# Patient Record
Sex: Male | Born: 1955 | Hispanic: Yes | Marital: Married | Smoking: Former smoker
Health system: Southern US, Community
[De-identification: ages and names within clinical notes are randomized; demographics above are authoritative.]

## PROBLEM LIST (undated history)

## (undated) DIAGNOSIS — M1990 Unspecified osteoarthritis, unspecified site: Secondary | ICD-10-CM

## (undated) DIAGNOSIS — R7303 Prediabetes: Secondary | ICD-10-CM

## (undated) DIAGNOSIS — R011 Cardiac murmur, unspecified: Secondary | ICD-10-CM

## (undated) HISTORY — PX: BILATERAL KNEE ARTHROSCOPY: SUR91

---

## 2013-07-18 ENCOUNTER — Emergency Department (INDEPENDENT_AMBULATORY_CARE_PROVIDER_SITE_OTHER)
Admission: EM | Admit: 2013-07-18 | Discharge: 2013-07-18 | Disposition: A | Discharge status: Home / Self Care | Payer: Managed Care, Other (non HMO) | Source: Home / Self Care | Attending: Family Medicine | Admitting: Family Medicine

## 2013-07-18 ENCOUNTER — Encounter: Payer: Self-pay | Admitting: Emergency Medicine

## 2013-07-18 DIAGNOSIS — Z87891 Personal history of nicotine dependence: Secondary | ICD-10-CM

## 2013-07-18 DIAGNOSIS — J069 Acute upper respiratory infection, unspecified: Secondary | ICD-10-CM

## 2013-07-18 DIAGNOSIS — R062 Wheezing: Secondary | ICD-10-CM

## 2013-07-18 MED ORDER — AZITHROMYCIN 250 MG PO TABS: ORAL_TABLET | ORAL | Status: DC

## 2013-07-18 MED ORDER — METHYLPREDNISOLONE ACETATE 80 MG/ML IJ SUSP
80.0000 mg | Freq: Once | INTRAMUSCULAR | Status: AC
  Administered 2013-07-18: 80 mg via INTRAMUSCULAR

## 2013-07-18 NOTE — ED Provider Notes (Signed)
CSN: [id number]     Arrival date & time 07/18/13  1244 History   First MD Initiated Contact with Patient 07/18/13 1251     Chief Complaint  Patient presents with  . Cough    HPI  URI Symptoms Onset: 2-3 weeks  Description: rhinorrhea, nasal congestion, cough, mild wheezing  Modifying factors:  Prior 30+ pack year smoking history   Symptoms Nasal discharge: yes Fever: no Sore throat: no Cough: yes Wheezing: mild intermittent  Ear pain: no GI symptoms: no Sick contacts: yes  Red Flags  Stiff neck: no Dyspnea: no Rash: no Swallowing difficulty: no  Sinusitis Risk Factors Headache/face pain: no Double sickening: no tooth pain: no  Allergy Risk Factors Sneezing: no Itchy scratchy throat: no Seasonal symptoms: no  Flu Risk Factors Headache: no muscle aches: no severe fatigue: no   History reviewed. No pertinent past medical history. History reviewed. No pertinent past surgical history. No family history on file. History  Substance Use Topics  . Smoking status: Former Smoker    Types: Cigarettes  . Smokeless tobacco: Not on file  . Alcohol Use: Yes    Review of Systems  All other systems reviewed and are negative.    Allergies  Review of patient's allergies indicates no known allergies.  Home Medications   Current Outpatient Rx  Name  Route  Sig  Dispense  Refill  . aspirin-sod bicarb-citric acid (ALKA-SELTZER) 325 MG TBEF tablet   Oral   Take 325 mg by mouth every 6 (six) hours as needed.         Marland Kitchen azithromycin (ZITHROMAX) 250 MG tablet      Take 2 tabs PO x 1 dose, then 1 tab PO QD x 4 days   6 tablet   0    BP 114/78  Pulse 87  Temp(Src) 97.7 F (36.5 C) (Oral)  Ht 6' (1.829 m)  Wt 226 lb (102.513 kg)  BMI 30.64 kg/m2  SpO2 97% Physical Exam  Constitutional: He appears well-developed and well-nourished.  HENT:  Head: Normocephalic and atraumatic.  Right Ear: External ear normal.  Left Ear: External ear normal.  +nasal  erythema, rhinorrhea bilaterally, + post oropharyngeal erythema    Eyes: Conjunctivae are normal. Pupils are equal, round, and reactive to light.  Neck: Normal range of motion. Neck supple.  Cardiovascular: Normal rate.   Pulmonary/Chest: Effort normal.  Faint wheezes in bases   Abdominal: Soft.  Musculoskeletal: Normal range of motion.  Neurological: He is alert.  Skin: Skin is warm.    ED Course  Procedures (including critical care time) Labs Review Labs Reviewed - No data to display Imaging Review No results found.  EKG Interpretation    Date/Time:    Ventricular Rate:    PR Interval:    QRS Duration:   QT Interval:    QTC Calculation:   R Axis:     Text Interpretation:              MDM   1. URI (upper respiratory infection)   2. Wheezing   3. Former smoker    depomedrol 80mg  IM x1  Will place on zpak for lower resp coverage.  Discussed infectious and resp red flags.  Followup as needed.     The patient and/or caregiver has been counseled thoroughly with regard to treatment plan and/or medications prescribed including dosage, schedule, interactions, rationale for use, and possible side effects and they verbalize understanding. Diagnoses and expected course of recovery discussed and will return  if not improved as expected or if the condition worsens. Patient and/or caregiver verbalized understanding.         Doree Albee, MD 07/18/13 [phone]

## 2013-07-18 NOTE — ED Notes (Signed)
Dry cough, congestion sinus pain and pressure x 2 weeks worse last 4 days

## 2017-01-20 ENCOUNTER — Emergency Department (INDEPENDENT_AMBULATORY_CARE_PROVIDER_SITE_OTHER)
Admission: EM | Admit: 2017-01-20 | Discharge: 2017-01-20 | Disposition: A | Discharge status: Home / Self Care | Payer: BLUE CROSS/BLUE SHIELD | Source: Home / Self Care | Attending: Family Medicine | Admitting: Family Medicine

## 2017-01-20 ENCOUNTER — Encounter: Payer: Self-pay | Admitting: *Deleted

## 2017-01-20 ENCOUNTER — Emergency Department (INDEPENDENT_AMBULATORY_CARE_PROVIDER_SITE_OTHER): Payer: BLUE CROSS/BLUE SHIELD

## 2017-01-20 DIAGNOSIS — S6991XA Unspecified injury of right wrist, hand and finger(s), initial encounter: Secondary | ICD-10-CM | POA: Diagnosis not present

## 2017-01-20 DIAGNOSIS — X58XXXA Exposure to other specified factors, initial encounter: Secondary | ICD-10-CM | POA: Diagnosis not present

## 2017-01-20 DIAGNOSIS — Y9353 Activity, golf: Secondary | ICD-10-CM

## 2017-01-20 DIAGNOSIS — S63659A Sprain of metacarpophalangeal joint of unspecified finger, initial encounter: Secondary | ICD-10-CM | POA: Diagnosis not present

## 2017-01-20 NOTE — ED Triage Notes (Signed)
Patient reports grounding his golf club yesterday and feeling pain to right hand immediately. Site began to swell later and unable to form a fist. H/o tendon repair to right hand in 1980s.

## 2017-01-20 NOTE — Discharge Instructions (Signed)
Apply ice pack for 15 to 20 minutes, 3 to 4 times daily  Continue until pain and swelling decrease.  Wear ace wrap until swelling decreases.  Begin range of motion and stretching exercises as tolerated.

## 2017-01-20 NOTE — ED Provider Notes (Signed)
[facility]    CSN: [id number] Arrival date & time: 01/20/17  0946     History   Chief Complaint Chief Complaint  Patient presents with  . Hand Pain    HPI Nathan Larson is a 61 y.o. male.   While playing golf yesterday, patient "grounded" his club and felt sudden pain in his left hand.  He developed subsequent swelling, and has had pain in his 3rd MCP joint.   The history is provided by the patient.  Hand Pain  This is a new problem. The current episode started yesterday. The problem occurs constantly. The problem has not changed since onset.Exacerbated by: flexing fingers. Nothing relieves the symptoms. Treatments tried: ice packs. The treatment provided mild relief.    History reviewed. No pertinent past medical history.  There are no active problems to display for this patient.   History reviewed. No pertinent surgical history.     Home Medications    Prior to Admission medications   Not on File    Family History Family History  Problem Relation Age of Onset  . Diabetes Mother     Social History Social History  Substance Use Topics  . Smoking status: Former Smoker    Types: Cigarettes  . Smokeless tobacco: Never Used  . Alcohol use Yes     Allergies   Patient has no known allergies.   Review of Systems Review of Systems  All other systems reviewed and are negative.    Physical Exam Triage Vital Signs ED Triage Vitals  Enc Vitals Group     BP 01/20/17 1003 107/64     Pulse Rate 01/20/17 1003 (!) 54     Resp --      Temp --      Temp src --      SpO2 01/20/17 1003 98 %     Weight 01/20/17 1003 218 lb (98.9 kg)     Height --      Head Circumference --      Peak Flow --      Pain Score 01/20/17 1004 3     Pain Loc --      Pain Edu? --      Excl. in GC? --    No data found.   Updated Vital Signs BP 107/64 (BP Location: Left Arm)   Pulse (!) 54   Wt 218 lb (98.9 kg)   SpO2 98%   BMI 29.57 kg/m   Visual  Acuity Right Eye Distance:   Left Eye Distance:   Bilateral Distance:    Right Eye Near:   Left Eye Near:    Bilateral Near:     Physical Exam  Constitutional: He appears well-developed and well-nourished. No distress.  HENT:  Head: Normocephalic.  Eyes: Pupils are equal, round, and reactive to light.  Cardiovascular: Normal rate.   Pulmonary/Chest: Effort normal.  Musculoskeletal:       Right hand: He exhibits decreased range of motion, tenderness, bony tenderness and swelling. He exhibits normal two-point discrimination, normal capillary refill, no deformity and no laceration. Normal sensation noted. He exhibits no wrist extension trouble.       Hands: Right hand has tenderness to palpation and swelling over the 3rd MCP joint.  Distal flexion/extension intact.  Neurological: He is alert.  Skin: Skin is warm and dry.  Nursing note and vitals reviewed.    UC Treatments / Results  Labs (all labs ordered are listed, but only abnormal results are displayed) Labs  Reviewed - No data to display  EKG  EKG Interpretation None       Radiology No results found.  Procedures Procedures (including critical care time)  Medications Ordered in UC Medications - No data to display   Initial Impression / Assessment and Plan / UC Course  I have reviewed the triage vital signs and the nursing notes.  Pertinent labs & imaging results that were available during my care of the patient were reviewed by me and considered in my medical decision making (see chart for details).    Ace wrap applied. Apply ice pack for 15 to 20 minutes, 3 to 4 times daily  Continue until pain and swelling decrease.  Wear ace wrap until swelling decreases.  Begin range of motion and stretching exercises as tolerated. Followup with Dr. Rodney Langtonhomas Thekkekandam or Dr. Clementeen GrahamEvan Corey ([facility]) if not improving about two weeks.     Final Clinical Impressions(s) / UC Diagnoses   Final diagnoses:  None      New Prescriptions New Prescriptions   No medications on file     Lattie HawBeese, Stephen A, MD 01/20/17 1059

## 2017-03-20 ENCOUNTER — Ambulatory Visit: Payer: Self-pay | Admitting: Orthopedic Surgery

## 2017-04-26 NOTE — Patient Instructions (Addendum)
Nathan Larson  04/26/2017   Your procedure is scheduled on: 05/11/17   Report to [facility] Main  Entrance            Take [place]  elevators to 3rd floor to  Short Stay Center at    0530 AM.    Call this number if you have problems the morning of surgery [phone]    Remember: ONLY 1 PERSON MAY GO WITH YOU TO SHORT STAY TO GET  READY MORNING OF YOUR SURGERY.  Do not eat food or drink liquids :After Midnight.     Take these medicines the morning of surgery with A SIP OF WATER: none                                You may not have any metal on your body including hair pins and              piercings  Do not wear jewelry,lotions, powders or perfumes, deodorant                     Men may shave face and neck.   Do not bring valuables to the hospital. [facility] IS NOT             RESPONSIBLE   FOR VALUABLES.  Contacts, dentures or bridgework may not be worn into surgery.  Leave suitcase in the car. After surgery it may be brought to your room.                Please read over the following fact sheets you were given: _____________________________________________________________________          [facility] - Preparing for Surgery Before surgery, you can play an important role.  Because skin is not sterile, your skin needs to be as free of germs as possible.  You can reduce the number of germs on your skin by washing with CHG (chlorahexidine gluconate) soap before surgery.  CHG is an antiseptic cleaner which kills germs and bonds with the skin to continue killing germs even after washing. Please DO NOT use if you have an allergy to CHG or antibacterial soaps.  If your skin becomes reddened/irritated stop using the CHG and inform your nurse when you arrive at Short Stay. Do not shave (including legs and underarms) for at least 48 hours prior to the first CHG shower.  You may shave your face/neck. Please follow these instructions carefully:  1.  Shower with  CHG Soap the night before surgery and the  morning of Surgery.  2.  If you choose to wash your hair, wash your hair first as usual with your  normal  shampoo.  3.  After you shampoo, rinse your hair and body thoroughly to remove the  shampoo.                           4.  Use CHG as you would any other liquid soap.  You can apply chg directly  to the skin and wash                       Gently with a scrungie or clean washcloth.  5.  Apply the CHG Soap to your body ONLY FROM THE NECK DOWN.  Do not use on face/ open                           Wound or open sores. Avoid contact with eyes, ears mouth and genitals (private parts).                       Wash face,  Genitals (private parts) with your normal soap.             6.  Wash thoroughly, paying special attention to the area where your surgery  will be performed.  7.  Thoroughly rinse your body with warm water from the neck down.  8.  DO NOT shower/wash with your normal soap after using and rinsing off  the CHG Soap.                9.  Pat yourself dry with a clean towel.            10.  Wear clean pajamas.            11.  Place clean sheets on your bed the night of your first shower and do not  sleep with pets. Day of Surgery : Do not apply any lotions/deodorants the morning of surgery.  Please wear clean clothes to the hospital/surgery center.  FAILURE TO FOLLOW THESE INSTRUCTIONS MAY RESULT IN THE CANCELLATION OF YOUR SURGERY PATIENT SIGNATURE_________________________________  NURSE SIGNATURE__________________________________  ________________________________________________________________________   Nathan Larson  An incentive spirometer is a tool that can help keep your lungs clear and active. This tool measures how well you are filling your lungs with each breath. Taking long deep breaths may help reverse or decrease the chance of developing breathing (pulmonary) problems (especially infection) following:  A long period of  time when you are unable to move or be active. BEFORE THE PROCEDURE   If the spirometer includes an indicator to show your best effort, your nurse or respiratory therapist will set it to a desired goal.  If possible, sit up straight or lean slightly forward. Try not to slouch.  Hold the incentive spirometer in an upright position. INSTRUCTIONS FOR USE  1. Sit on the edge of your bed if possible, or sit up as far as you can in bed or on a chair. 2. Hold the incentive spirometer in an upright position. 3. Breathe out normally. 4. Place the mouthpiece in your mouth and seal your lips tightly around it. 5. Breathe in slowly and as deeply as possible, raising the piston or the ball toward the top of the column. 6. Hold your breath for 3-5 seconds or for as long as possible. Allow the piston or ball to fall to the bottom of the column. 7. Remove the mouthpiece from your mouth and breathe out normally. 8. Rest for a few seconds and repeat Steps 1 through 7 at least 10 times every 1-2 hours when you are awake. Take your time and take a few normal breaths between deep breaths. 9. The spirometer may include an indicator to show your best effort. Use the indicator as a goal to work toward during each repetition. 10. After each set of 10 deep breaths, practice coughing to be sure your lungs are clear. If you have an incision (the cut made at the time of surgery), support your incision when coughing by placing a pillow or rolled up towels firmly against it. Once you are able to get out of  bed, walk around indoors and cough well. You may stop using the incentive spirometer when instructed by your caregiver.  RISKS AND COMPLICATIONS  Take your time so you do not get dizzy or light-headed.  If you are in pain, you may need to take or ask for pain medication before doing incentive spirometry. It is harder to take a deep breath if you are having pain. AFTER USE  Rest and breathe slowly and easily.  It can  be helpful to keep track of a log of your progress. Your caregiver can provide you with a simple table to help with this. If you are using the spirometer at home, follow these instructions: SEEK MEDICAL CARE IF:   You are having difficultly using the spirometer.  You have trouble using the spirometer as often as instructed.  Your pain medication is not giving enough relief while using the spirometer.  You develop fever of 100.5 F (38.1 C) or higher. SEEK IMMEDIATE MEDICAL CARE IF:   You cough up bloody sputum that had not been present before.  You develop fever of 102 F (38.9 C) or greater.  You develop worsening pain at or near the incision site. MAKE SURE YOU:   Understand these instructions.  Will watch your condition.  Will get help right away if you are not doing well or get worse. Document Released: 11/28/2006 Document Revised: 10/10/2011 Document Reviewed: 01/29/2007 [facility] Patient Information 2014 [place], [place].   ________________________________________________________________________

## 2017-04-26 NOTE — Progress Notes (Signed)
Clearance Dr. Andi Devon 03/08/17 on chart

## 2017-05-01 ENCOUNTER — Encounter (HOSPITAL_COMMUNITY): Payer: Self-pay

## 2017-05-01 ENCOUNTER — Encounter (HOSPITAL_COMMUNITY)
Admission: RE | Admit: 2017-05-01 | Discharge: 2017-05-01 | Disposition: A | Discharge status: Home / Self Care | Payer: BLUE CROSS/BLUE SHIELD | Source: Ambulatory Visit | Attending: Specialist | Admitting: Specialist

## 2017-05-01 DIAGNOSIS — Z01818 Encounter for other preprocedural examination: Secondary | ICD-10-CM | POA: Diagnosis not present

## 2017-05-01 DIAGNOSIS — M1712 Unilateral primary osteoarthritis, left knee: Secondary | ICD-10-CM | POA: Insufficient documentation

## 2017-05-01 HISTORY — DX: Cardiac murmur, unspecified: R01.1

## 2017-05-01 HISTORY — DX: Unspecified osteoarthritis, unspecified site: M19.90

## 2017-05-01 LAB — CBC
HEMATOCRIT: 41.5 % (ref 39.0–52.0)
HEMOGLOBIN: 13.9 g/dL (ref 13.0–17.0)
MCH: 28.9 pg (ref 26.0–34.0)
MCHC: 33.5 g/dL (ref 30.0–36.0)
MCV: 86.3 fL (ref 78.0–100.0)
Platelets: 246 10*3/uL (ref 150–400)
RBC: 4.81 MIL/uL (ref 4.22–5.81)
RDW: 12.9 % (ref 11.5–15.5)
WBC: 5.6 10*3/uL (ref 4.0–10.5)

## 2017-05-01 LAB — BASIC METABOLIC PANEL
ANION GAP: 8 (ref 5–15)
BUN: 12 mg/dL (ref 6–20)
CHLORIDE: 104 mmol/L (ref 101–111)
CO2: 27 mmol/L (ref 22–32)
Calcium: 9.5 mg/dL (ref 8.9–10.3)
Creatinine, Ser: 0.93 mg/dL (ref 0.61–1.24)
GFR calc Af Amer: >60 mL/min (ref >60)
GFR calc non Af Amer: >60 mL/min (ref >60)
GLUCOSE: 109 mg/dL — AB (ref 65–99)
POTASSIUM: 4.1 mmol/L (ref 3.5–5.1)
Sodium: 139 mmol/L (ref 135–145)

## 2017-05-01 LAB — URINALYSIS, ROUTINE W REFLEX MICROSCOPIC
Bilirubin Urine: NEGATIVE
GLUCOSE, UA: NEGATIVE mg/dL
HGB URINE DIPSTICK: NEGATIVE
Ketones, ur: NEGATIVE mg/dL
LEUKOCYTES UA: NEGATIVE
Nitrite: NEGATIVE
PROTEIN: NEGATIVE mg/dL
SPECIFIC GRAVITY, URINE: 1.005 (ref 1.005–1.030)
pH: 7 (ref 5.0–8.0)

## 2017-05-01 LAB — SURGICAL PCR SCREEN
MRSA, PCR: NEGATIVE
Staphylococcus aureus: NEGATIVE

## 2017-05-01 LAB — PROTIME-INR
INR: 1.02
Prothrombin Time: 13.3 seconds (ref 11.4–15.2)

## 2017-05-01 LAB — APTT: APTT: 27 s (ref 24–36)

## 2017-05-03 ENCOUNTER — Ambulatory Visit: Payer: Self-pay | Admitting: Orthopedic Surgery

## 2017-05-03 NOTE — H&P (Signed)
Nathan Larson DOB: 04/11/1956 Married / Language: English / Race: White Male  H&P Date:  05/03/17  CC: Left knee pain  History of Present Illness The patient is a 60 year old male who comes in today for a preoperative History and Physical. The patient is scheduled for a left total knee arthroplasty to be performed by Dr. Jeffrey C. Beane, MD at [facility] on 05/11/17 . Nathan Larson reports progressively worsening chronic pain bilateral knees, left worse than right, with prior hx of arthroscopies, refractory to PT/HEP, quad strengthening, activity modifications, relative rest, NSAIDs. He reports weightbearing pain and instability interfering with ADLs and quality of life at this point. He desires to proceed with surgery.  Dr. Beane and the patient mutually agreed to proceed with a left total knee replacement. Risks and benefits of the procedure were discussed including stiffness, suboptimal range of motion, persistent pain, infection requiring removal of prosthesis and reinsertion, need for prophylactic antibiotics in the future, for example, dental procedures, possible need for manipulation, revision in the future and also anesthetic complications including DVT, PE, etc. We discussed the perioperative course, time in the hospital, postoperative recovery and the need for elevation to control swelling. We also discussed the predicted range of motion and the probability that squatting and kneeling would be unobtainable in the future. In addition, postoperative anticoagulation was discussed. We have obtained preoperative medical clearance as necessary. Provided illustrated handout and discussed it in detail. They will enroll in the total joint replacement educational forum at the hospital.  [facility] pre-op appt already completed earlier this week.  Problem List/Past Medical Hx Chronic pain of both knees (M25.561, M25.562)  Primary osteoarthritis of left knee (M17.12)   Allergies  No Known Drug  Allergies [03/07/2017]:  Family History Congestive Heart Failure  Father. Diabetes Mellitus  Mother, Sister. Drug / Alcohol Addiction  Father. Heart Disease  Father. First Degree Relatives   Social History Tobacco use  Former smoker. 03/07/2017: smoke(d) 1/2 pack(s) per day Children  2 Current drinker  03/07/2017: Currently drinks beer only occasionally per week Current work status  working part time Exercise  Exercises weekly; does other Living situation  live with spouse, 2 level house, can stay on 1st floor post-op Marital status  married No history of drug/alcohol rehab  Not under pain contract  Number of flights of stairs before winded  2-3 Tobacco / smoke exposure  03/07/2017: no Post-Surgical Plans  home with HHPT vs. outpt PT  Medication History  Glucosamine Chondr 500 Complex (Oral) Specific strength unknown - Active. Aspirin (81MG Tablet, Oral) Active. Multivitamin (Oral) Active. Medications Reconciled  Past Surgical History  Arthroscopy of Knee  bilateral Colon Polyp Removal - Colonoscopy   Review of Systems General Not Present- Chills, Fatigue, Fever, Memory Loss, Night Sweats, Weight Gain and Weight Loss. Skin Not Present- Eczema, Hives, Itching, Lesions and Rash. HEENT Not Present- Dentures, Double Vision, Headache, Hearing Loss, Tinnitus and Visual Loss. Respiratory Not Present- Allergies, Chronic Cough, Coughing up blood, Shortness of breath at rest and Shortness of breath with exertion. Cardiovascular Not Present- Chest Pain, Difficulty Breathing Lying Down, Murmur, Palpitations, Racing/skipping heartbeats and Swelling. Gastrointestinal Not Present- Abdominal Pain, Bloody Stool, Constipation, Diarrhea, Difficulty Swallowing, Heartburn, Jaundice, Loss of appetitie, Nausea and Vomiting. Male Genitourinary Not Present- Blood in Urine, Discharge, Flank Pain, Incontinence, Painful Urination, Urgency, Urinary frequency, Urinary Retention,  Urinating at Night and Weak urinary stream. Musculoskeletal Present- Joint Pain, Joint Stiffness and Morning Stiffness. Not Present- Back Pain, Joint Swelling, Muscle   Pain, Muscle Weakness and Spasms. Neurological Not Present- Blackout spells, Difficulty with balance, Dizziness, Paralysis, Tremor and Weakness. Psychiatric Not Present- Insomnia.  Physical Exam General Mental Status -Alert, cooperative and good historian. General Appearance-pleasant, Not in acute distress. Orientation-Oriented X3. Build & Nutrition-Well nourished and Well developed.  Head and Neck Head-normocephalic, atraumatic . Neck Global Assessment - supple, no bruit auscultated on the right, no bruit auscultated on the left.  Eye Pupil - Bilateral-Regular and Round. Motion - Bilateral-EOMI.  Chest and Lung Exam Auscultation Breath sounds - clear at anterior chest wall and clear at posterior chest wall. Adventitious sounds - No Adventitious sounds.  Cardiovascular Auscultation Rhythm - Regular rate and rhythm. Heart Sounds - S1 WNL and S2 WNL. Murmurs & Other Heart Sounds - Auscultation of the heart reveals - No Murmurs.  Abdomen Palpation/Percussion Tenderness - Abdomen is non-tender to palpation. Rigidity (guarding) - Abdomen is soft. Auscultation Auscultation of the abdomen reveals - Bowel sounds normal.  Male Genitourinary Not done, not pertinent to present illness  Musculoskeletal On exam, I see a very healthy, well-fit male. He walks with an antalgic gait and a slight varus thrust in the knees, right greater than left. Exquisitely tender in the medial joint line bilaterally. He has patellofemoral pain with compression bilaterally. His range is 0 to 130. He has trace effusion.  Knee exam on inspection reveals no evidence of soft tissue swelling, ecchymosis, deformity or erythema. On palpation there is no tenderness in the lateral joint line. No patellofemoral pain with compression.  Nontender over the fibular head or the peroneal nerve. Nontender over the quadriceps insertion of the patellar ligament insertion. The range of motion was full. Provocative maneuvers revealed a negative Lachman, negative anterior and posterior drawer and a negative McMurray. No instability was noted with varus and valgus stressing at 0 or 30 degrees. On manual motor test the quadriceps and hamstrings were 5/5. Sensory exam was intact to light touch.  Imaging AP, standing and lateral Merchant demonstrates bone-on-bone arthrosis, medial compartment with varus deformity. He has patellofemoral and lateral compartment changes as well.  Assessment & Plan  Primary osteoarthritis of left knee (M17.12)  Pt with end-stage B/L knee DJD, bone-on-bone, refractory to conservative tx, scheduled for left total knee replacement by Dr. Beane. We again discussed the procedure itself as well as risks, complications and alternatives, including but not limited to DVT, PE, infx, bleeding, failure of procedure, need for secondary procedure including manipulation, nerve injury, ongoing pain/symptoms, anesthesia risk, even stroke or death. Also discussed typical post-op protocols, activity restrictions, need for PT, flexion/extension exercises, time out of work. Discussed need for DVT ppx post-op per protocol. Discussed dental ppx and infx prevention. Also discussed limitations post-operatively such as kneeling and squatting. All questions were answered. Patient desires to proceed with surgery as scheduled. Will hold supplements, vitamins, ASA and NSAIDs accordingly. Will remain NPO after MN night before surgery. We reviewed his pre-op labs. Anticipate hospital stay to include at least 2 midnights given medical history and to ensure proper pain control. Plan ASA 325mg BID for DVT ppx post-op. Plan pain medication, Robaxin, Colace, Miralax and ASA post-op. Plan home with HHPT vs. straight to outpt PT post-op with family members at  home for assistance. Will follow up 10-14 days post-op for staple removal and xrays.  He also plans to proceed with the right knee prior to the end of the year. We discussed at least 6 weeks would be recommended between left and right knee.  Plan LEFT   total knee replacement  Signed electronically by Taylor Spilde M Havilah Topor, PA-C for Dr. Beane  

## 2017-05-11 ENCOUNTER — Inpatient Hospital Stay (HOSPITAL_COMMUNITY)
Admission: RE | Admit: 2017-05-11 | Discharge: 2017-05-13 | DRG: 470 | Disposition: A | Discharge status: Home Health | Payer: BLUE CROSS/BLUE SHIELD | Source: Ambulatory Visit | Attending: Specialist | Admitting: Specialist

## 2017-05-11 ENCOUNTER — Encounter (HOSPITAL_COMMUNITY)
Admission: RE | Disposition: A | Discharge status: Home Health | Payer: Self-pay | Source: Ambulatory Visit | Attending: Specialist

## 2017-05-11 ENCOUNTER — Inpatient Hospital Stay (HOSPITAL_COMMUNITY): Payer: BLUE CROSS/BLUE SHIELD | Admitting: Anesthesiology

## 2017-05-11 ENCOUNTER — Encounter (HOSPITAL_COMMUNITY): Payer: Self-pay | Admitting: *Deleted

## 2017-05-11 ENCOUNTER — Inpatient Hospital Stay (HOSPITAL_COMMUNITY): Payer: BLUE CROSS/BLUE SHIELD

## 2017-05-11 DIAGNOSIS — Z96659 Presence of unspecified artificial knee joint: Secondary | ICD-10-CM

## 2017-05-11 DIAGNOSIS — G8929 Other chronic pain: Secondary | ICD-10-CM | POA: Diagnosis present

## 2017-05-11 DIAGNOSIS — M170 Bilateral primary osteoarthritis of knee: Principal | ICD-10-CM | POA: Diagnosis present

## 2017-05-11 DIAGNOSIS — M21162 Varus deformity, not elsewhere classified, left knee: Secondary | ICD-10-CM | POA: Diagnosis present

## 2017-05-11 DIAGNOSIS — Z8249 Family history of ischemic heart disease and other diseases of the circulatory system: Secondary | ICD-10-CM | POA: Diagnosis not present

## 2017-05-11 DIAGNOSIS — M1712 Unilateral primary osteoarthritis, left knee: Secondary | ICD-10-CM | POA: Diagnosis present

## 2017-05-11 DIAGNOSIS — Z87891 Personal history of nicotine dependence: Secondary | ICD-10-CM

## 2017-05-11 DIAGNOSIS — Z833 Family history of diabetes mellitus: Secondary | ICD-10-CM

## 2017-05-11 HISTORY — PX: TOTAL KNEE ARTHROPLASTY: SHX125

## 2017-05-11 SURGERY — ARTHROPLASTY, KNEE, TOTAL
Anesthesia: General | Site: Knee | Laterality: Left

## 2017-05-11 MED ORDER — ACETAMINOPHEN 650 MG RE SUPP: 650.0000 mg | Freq: Four times a day (QID) | RECTAL | Status: DC | PRN

## 2017-05-11 MED ORDER — METOCLOPRAMIDE HCL 5 MG PO TABS: 5.0000 mg | ORAL_TABLET | Freq: Three times a day (TID) | ORAL | Status: DC | PRN

## 2017-05-11 MED ORDER — ACETAMINOPHEN 10 MG/ML IV SOLN
1000.0000 mg | Freq: Four times a day (QID) | INTRAVENOUS | Status: AC
  Administered 2017-05-11 – 2017-05-12 (×3): 1000 mg via INTRAVENOUS
  Filled 2017-05-11 (×4): qty 100

## 2017-05-11 MED ORDER — HYDROMORPHONE HCL 2 MG/ML IJ SOLN
INTRAMUSCULAR | Status: AC
  Filled 2017-05-11: qty 1

## 2017-05-11 MED ORDER — OXYCODONE HCL 5 MG PO TABS
5.0000 mg | ORAL_TABLET | ORAL | Status: DC | PRN
  Administered 2017-05-11 (×2): 5 mg via ORAL
  Administered 2017-05-12: 10 mg via ORAL
  Administered 2017-05-12 (×2): 5 mg via ORAL
  Administered 2017-05-12 – 2017-05-13 (×3): 10 mg via ORAL
  Filled 2017-05-11 (×2): qty 1
  Filled 2017-05-11 (×2): qty 2
  Filled 2017-05-11 (×5): qty 1
  Filled 2017-05-11: qty 2

## 2017-05-11 MED ORDER — ASPIRIN EC 325 MG PO TBEC
325.0000 mg | DELAYED_RELEASE_TABLET | Freq: Two times a day (BID) | ORAL | Status: DC
  Administered 2017-05-12 – 2017-05-13 (×3): 325 mg via ORAL
  Filled 2017-05-11 (×3): qty 1

## 2017-05-11 MED ORDER — ONDANSETRON HCL 4 MG/2ML IJ SOLN: 4.0000 mg | Freq: Four times a day (QID) | INTRAMUSCULAR | Status: DC | PRN

## 2017-05-11 MED ORDER — FENTANYL CITRATE (PF) 100 MCG/2ML IJ SOLN
INTRAMUSCULAR | Status: AC
  Filled 2017-05-11: qty 2

## 2017-05-11 MED ORDER — LACTATED RINGERS IV SOLN: INTRAVENOUS | Status: DC

## 2017-05-11 MED ORDER — BISACODYL 5 MG PO TBEC: 5.0000 mg | DELAYED_RELEASE_TABLET | Freq: Every day | ORAL | Status: DC | PRN

## 2017-05-11 MED ORDER — PROMETHAZINE HCL 25 MG/ML IJ SOLN: 6.2500 mg | INTRAMUSCULAR | Status: DC | PRN

## 2017-05-11 MED ORDER — EPHEDRINE 5 MG/ML INJ
INTRAVENOUS | Status: AC
  Filled 2017-05-11: qty 10

## 2017-05-11 MED ORDER — METHOCARBAMOL 1000 MG/10ML IJ SOLN
500.0000 mg | Freq: Four times a day (QID) | INTRAVENOUS | Status: DC | PRN
  Administered 2017-05-11: 500 mg via INTRAVENOUS
  Filled 2017-05-11: qty 550

## 2017-05-11 MED ORDER — METOCLOPRAMIDE HCL 5 MG/ML IJ SOLN: 5.0000 mg | Freq: Three times a day (TID) | INTRAMUSCULAR | Status: DC | PRN

## 2017-05-11 MED ORDER — BUPIVACAINE-EPINEPHRINE (PF) 0.25% -1:200000 IJ SOLN
INTRAMUSCULAR | Status: AC
  Filled 2017-05-11: qty 30

## 2017-05-11 MED ORDER — KCL IN DEXTROSE-NACL 20-5-0.45 MEQ/L-%-% IV SOLN
INTRAVENOUS | Status: AC
  Administered 2017-05-11: 13:00:00 via INTRAVENOUS
  Filled 2017-05-11 (×2): qty 1000

## 2017-05-11 MED ORDER — METHOCARBAMOL 500 MG PO TABS
500.0000 mg | ORAL_TABLET | Freq: Four times a day (QID) | ORAL | Status: DC | PRN
  Administered 2017-05-12 – 2017-05-13 (×3): 500 mg via ORAL
  Filled 2017-05-11 (×4): qty 1

## 2017-05-11 MED ORDER — DOCUSATE SODIUM 100 MG PO CAPS: 100.0000 mg | ORAL_CAPSULE | Freq: Two times a day (BID) | ORAL | 1 refills | Status: DC | PRN

## 2017-05-11 MED ORDER — OXYCODONE HCL 5 MG PO TABS: 5.0000 mg | ORAL_TABLET | Freq: Once | ORAL | Status: DC | PRN

## 2017-05-11 MED ORDER — EPHEDRINE SULFATE-NACL 50-0.9 MG/10ML-% IV SOSY
PREFILLED_SYRINGE | INTRAVENOUS | Status: DC | PRN
  Administered 2017-05-11: 5 mg via INTRAVENOUS

## 2017-05-11 MED ORDER — PROPOFOL 10 MG/ML IV BOLUS
INTRAVENOUS | Status: AC
  Filled 2017-05-11: qty 60

## 2017-05-11 MED ORDER — SODIUM CHLORIDE 0.9 % IV SOLN
1000.0000 mg | INTRAVENOUS | Status: AC
  Administered 2017-05-11: 1000 mg via INTRAVENOUS
  Filled 2017-05-11: qty 1100

## 2017-05-11 MED ORDER — MENTHOL 3 MG MT LOZG: 1.0000 | LOZENGE | OROMUCOSAL | Status: DC | PRN

## 2017-05-11 MED ORDER — POLYETHYLENE GLYCOL 3350 17 G PO PACK: 17.0000 g | PACK | Freq: Every day | ORAL | Status: DC | PRN

## 2017-05-11 MED ORDER — ROPIVACAINE HCL 5 MG/ML IJ SOLN
INTRAMUSCULAR | Status: DC | PRN
  Administered 2017-05-11: 30 mL via PERINEURAL

## 2017-05-11 MED ORDER — LACTATED RINGERS IV SOLN
INTRAVENOUS | Status: DC | PRN
  Administered 2017-05-11 (×2): via INTRAVENOUS

## 2017-05-11 MED ORDER — CEFAZOLIN SODIUM-DEXTROSE 2-4 GM/100ML-% IV SOLN
INTRAVENOUS | Status: AC
  Filled 2017-05-11: qty 100

## 2017-05-11 MED ORDER — ROCURONIUM BROMIDE 50 MG/5ML IV SOSY
PREFILLED_SYRINGE | INTRAVENOUS | Status: AC
  Filled 2017-05-11: qty 5

## 2017-05-11 MED ORDER — OXYCODONE HCL 5 MG/5ML PO SOLN
5.0000 mg | Freq: Once | ORAL | Status: DC | PRN
  Filled 2017-05-11: qty 5

## 2017-05-11 MED ORDER — ACETAMINOPHEN 10 MG/ML IV SOLN
1000.0000 mg | INTRAVENOUS | Status: AC
  Administered 2017-05-11: 1000 mg via INTRAVENOUS

## 2017-05-11 MED ORDER — SODIUM CHLORIDE 0.9 % IV SOLN
INTRAVENOUS | Status: AC
  Filled 2017-05-11: qty 500000

## 2017-05-11 MED ORDER — HYDROMORPHONE HCL 1 MG/ML IJ SOLN
INTRAMUSCULAR | Status: DC | PRN
  Administered 2017-05-11 (×2): 0.5 mg via INTRAVENOUS

## 2017-05-11 MED ORDER — PHENOL 1.4 % MT LIQD: 1.0000 | OROMUCOSAL | Status: DC | PRN

## 2017-05-11 MED ORDER — LIDOCAINE 2% (20 MG/ML) 5 ML SYRINGE
INTRAMUSCULAR | Status: AC
  Filled 2017-05-11: qty 5

## 2017-05-11 MED ORDER — DOCUSATE SODIUM 100 MG PO CAPS
100.0000 mg | ORAL_CAPSULE | Freq: Two times a day (BID) | ORAL | Status: DC
  Administered 2017-05-11 – 2017-05-12 (×3): 100 mg via ORAL
  Filled 2017-05-11 (×4): qty 1

## 2017-05-11 MED ORDER — PROPOFOL 500 MG/50ML IV EMUL
INTRAVENOUS | Status: DC | PRN
  Administered 2017-05-11: 25 ug/kg/min via INTRAVENOUS

## 2017-05-11 MED ORDER — MAGNESIUM CITRATE PO SOLN: 1.0000 | Freq: Once | ORAL | Status: DC | PRN

## 2017-05-11 MED ORDER — ACETAMINOPHEN 10 MG/ML IV SOLN
INTRAVENOUS | Status: AC
  Filled 2017-05-11: qty 100

## 2017-05-11 MED ORDER — ACETAMINOPHEN 325 MG PO TABS
650.0000 mg | ORAL_TABLET | Freq: Four times a day (QID) | ORAL | Status: DC | PRN
  Filled 2017-05-11: qty 2

## 2017-05-11 MED ORDER — ALUM & MAG HYDROXIDE-SIMETH 200-200-20 MG/5ML PO SUSP: 30.0000 mL | ORAL | Status: DC | PRN

## 2017-05-11 MED ORDER — DEXAMETHASONE SODIUM PHOSPHATE 10 MG/ML IJ SOLN
INTRAMUSCULAR | Status: DC | PRN
  Administered 2017-05-11: 10 mg via INTRAVENOUS

## 2017-05-11 MED ORDER — HYDROMORPHONE HCL-NACL 0.5-0.9 MG/ML-% IV SOSY
0.2500 mg | PREFILLED_SYRINGE | INTRAVENOUS | Status: DC | PRN
  Administered 2017-05-11: 0.5 mg via INTRAVENOUS

## 2017-05-11 MED ORDER — ONDANSETRON HCL 4 MG/2ML IJ SOLN
INTRAMUSCULAR | Status: DC | PRN
Start: 2017-05-11 — End: 2017-05-11
  Administered 2017-05-11: 4 mg via INTRAVENOUS

## 2017-05-11 MED ORDER — CEFAZOLIN SODIUM-DEXTROSE 2-4 GM/100ML-% IV SOLN
2.0000 g | Freq: Four times a day (QID) | INTRAVENOUS | Status: AC
  Administered 2017-05-11 – 2017-05-12 (×3): 2 g via INTRAVENOUS
  Filled 2017-05-11 (×3): qty 100

## 2017-05-11 MED ORDER — POLYETHYLENE GLYCOL 3350 17 G PO PACK: 17.0000 g | PACK | Freq: Every day | ORAL | 0 refills | Status: DC

## 2017-05-11 MED ORDER — MIDAZOLAM HCL 5 MG/5ML IJ SOLN
INTRAMUSCULAR | Status: DC | PRN
  Administered 2017-05-11: 2 mg via INTRAVENOUS

## 2017-05-11 MED ORDER — MIDAZOLAM HCL 2 MG/2ML IJ SOLN
INTRAMUSCULAR | Status: AC
  Filled 2017-05-11: qty 2

## 2017-05-11 MED ORDER — SODIUM CHLORIDE 0.9 % IV SOLN
INTRAVENOUS | Status: DC | PRN
  Administered 2017-05-11: 500 mL

## 2017-05-11 MED ORDER — OXYCODONE-ACETAMINOPHEN 5-325 MG PO TABS: 1.0000 | ORAL_TABLET | ORAL | 0 refills | Status: DC | PRN

## 2017-05-11 MED ORDER — GLYCOPYRROLATE 0.2 MG/ML IV SOSY
PREFILLED_SYRINGE | INTRAVENOUS | Status: DC | PRN
  Administered 2017-05-11: .3 mg via INTRAVENOUS

## 2017-05-11 MED ORDER — HYDROMORPHONE HCL-NACL 0.5-0.9 MG/ML-% IV SOSY
1.0000 mg | PREFILLED_SYRINGE | INTRAVENOUS | Status: DC | PRN
  Filled 2017-05-11: qty 2

## 2017-05-11 MED ORDER — FENTANYL CITRATE (PF) 100 MCG/2ML IJ SOLN
INTRAMUSCULAR | Status: DC | PRN
  Administered 2017-05-11 (×4): 50 ug via INTRAVENOUS

## 2017-05-11 MED ORDER — METHOCARBAMOL 500 MG PO TABS
500.0000 mg | ORAL_TABLET | Freq: Four times a day (QID) | ORAL | 1 refills | Status: DC | PRN
Start: 2017-05-11 — End: 2017-07-07

## 2017-05-11 MED ORDER — PROPOFOL 10 MG/ML IV BOLUS
INTRAVENOUS | Status: DC | PRN
  Administered 2017-05-11: 150 mg via INTRAVENOUS
  Administered 2017-05-11: 100 mg via INTRAVENOUS

## 2017-05-11 MED ORDER — BUPIVACAINE-EPINEPHRINE 0.25% -1:200000 IJ SOLN
INTRAMUSCULAR | Status: DC | PRN
  Administered 2017-05-11: 30 mL

## 2017-05-11 MED ORDER — ROCURONIUM BROMIDE 10 MG/ML (PF) SYRINGE
PREFILLED_SYRINGE | INTRAVENOUS | Status: DC | PRN
  Administered 2017-05-11: 50 mg via INTRAVENOUS

## 2017-05-11 MED ORDER — HYDROMORPHONE HCL-NACL 0.5-0.9 MG/ML-% IV SOSY
PREFILLED_SYRINGE | INTRAVENOUS | Status: AC
  Administered 2017-05-11: 0.5 mg via INTRAVENOUS
  Filled 2017-05-11: qty 4

## 2017-05-11 MED ORDER — RISAQUAD PO CAPS
1.0000 | ORAL_CAPSULE | Freq: Every day | ORAL | Status: DC
  Administered 2017-05-11 – 2017-05-13 (×3): 1 via ORAL
  Filled 2017-05-11 (×3): qty 1

## 2017-05-11 MED ORDER — ONDANSETRON HCL 4 MG PO TABS: 4.0000 mg | ORAL_TABLET | Freq: Four times a day (QID) | ORAL | Status: DC | PRN

## 2017-05-11 MED ORDER — CEFAZOLIN SODIUM-DEXTROSE 2-4 GM/100ML-% IV SOLN
2.0000 g | INTRAVENOUS | Status: AC
  Administered 2017-05-11: 2 g via INTRAVENOUS

## 2017-05-11 MED ORDER — STERILE WATER FOR IRRIGATION IR SOLN
Status: DC | PRN
  Administered 2017-05-11: 2000 mL

## 2017-05-11 MED ORDER — ONDANSETRON HCL 4 MG/2ML IJ SOLN
INTRAMUSCULAR | Status: AC
  Filled 2017-05-11: qty 2

## 2017-05-11 MED ORDER — DEXAMETHASONE SODIUM PHOSPHATE 10 MG/ML IJ SOLN
INTRAMUSCULAR | Status: AC
  Filled 2017-05-11: qty 1

## 2017-05-11 MED ORDER — ASPIRIN EC 325 MG PO TBEC: 325.0000 mg | DELAYED_RELEASE_TABLET | Freq: Two times a day (BID) | ORAL | 1 refills | Status: DC

## 2017-05-11 MED ORDER — DIPHENHYDRAMINE HCL 12.5 MG/5ML PO ELIX: 12.5000 mg | ORAL_SOLUTION | ORAL | Status: DC | PRN

## 2017-05-11 MED ORDER — SUGAMMADEX SODIUM 200 MG/2ML IV SOLN
INTRAVENOUS | Status: DC | PRN
  Administered 2017-05-11: 200 mg via INTRAVENOUS

## 2017-05-11 MED ORDER — LIDOCAINE 2% (20 MG/ML) 5 ML SYRINGE
INTRAMUSCULAR | Status: DC | PRN
  Administered 2017-05-11: 50 mg via INTRAVENOUS

## 2017-05-11 SURGICAL SUPPLY — 60 items
BAG ZIPLOCK 12X15 (MISCELLANEOUS) IMPLANT
BANDAGE ACE 4X5 VEL STRL LF (GAUZE/BANDAGES/DRESSINGS) ×3 IMPLANT
BANDAGE ACE 6X5 VEL STRL LF (GAUZE/BANDAGES/DRESSINGS) ×3 IMPLANT
BLADE SAG 18X100X1.27 (BLADE) ×3 IMPLANT
BLADE SAW SGTL 11.0X1.19X90.0M (BLADE) ×3 IMPLANT
BLADE SAW SGTL 13.0X1.19X90.0M (BLADE) IMPLANT
CAPT KNEE TOTAL 3 ATTUNE ×3 IMPLANT
CEMENT HV SMART SET (Cement) ×6 IMPLANT
CLOSURE WOUND 1/2 X4 (GAUZE/BANDAGES/DRESSINGS)
CLOTH 2% CHLOROHEXIDINE 3PK (PERSONAL CARE ITEMS) ×3 IMPLANT
COVER SURGICAL LIGHT HANDLE (MISCELLANEOUS) ×3 IMPLANT
CUFF TOURN SGL QUICK 34 (TOURNIQUET CUFF) ×2
CUFF TRNQT CYL 34X4X40X1 (TOURNIQUET CUFF) ×1 IMPLANT
DECANTER SPIKE VIAL GLASS SM (MISCELLANEOUS) ×3 IMPLANT
DRAPE INCISE IOBAN 66X45 STRL (DRAPES) IMPLANT
DRAPE ORTHO SPLIT 77X108 STRL (DRAPES) ×4
DRAPE SHEET LG 3/4 BI-LAMINATE (DRAPES) ×3 IMPLANT
DRAPE SURG ORHT 6 SPLT 77X108 (DRAPES) ×2 IMPLANT
DRAPE U-SHAPE 47X51 STRL (DRAPES) ×3 IMPLANT
DRSG AQUACEL AG ADV 3.5X10 (GAUZE/BANDAGES/DRESSINGS) ×3 IMPLANT
DRSG TEGADERM 4X4.75 (GAUZE/BANDAGES/DRESSINGS) IMPLANT
DURAPREP 26ML APPLICATOR (WOUND CARE) ×3 IMPLANT
ELECT REM PT RETURN 15FT ADLT (MISCELLANEOUS) ×3 IMPLANT
EVACUATOR 1/8 PVC DRAIN (DRAIN) IMPLANT
GAUZE SPONGE 2X2 8PLY STRL LF (GAUZE/BANDAGES/DRESSINGS) IMPLANT
GLOVE BIOGEL PI IND STRL 7.0 (GLOVE) ×1 IMPLANT
GLOVE BIOGEL PI IND STRL 8 (GLOVE) ×1 IMPLANT
GLOVE BIOGEL PI INDICATOR 7.0 (GLOVE) ×2
GLOVE BIOGEL PI INDICATOR 8 (GLOVE) ×2
GLOVE SURG SS PI 7.0 STRL IVOR (GLOVE) ×3 IMPLANT
GLOVE SURG SS PI 7.5 STRL IVOR (GLOVE) IMPLANT
GLOVE SURG SS PI 8.0 STRL IVOR (GLOVE) ×6 IMPLANT
GOWN STRL REUS W/TWL XL LVL3 (GOWN DISPOSABLE) ×6 IMPLANT
HANDPIECE INTERPULSE COAX TIP (DISPOSABLE) ×2
HEMOSTAT SPONGE AVITENE ULTRA (HEMOSTASIS) IMPLANT
IMMOBILIZER KNEE 20 (SOFTGOODS) ×3
IMMOBILIZER KNEE 20 THIGH 36 (SOFTGOODS) ×1 IMPLANT
MANIFOLD NEPTUNE II (INSTRUMENTS) ×3 IMPLANT
NS IRRIG 1000ML POUR BTL (IV SOLUTION) IMPLANT
PACK TOTAL KNEE CUSTOM (KITS) ×3 IMPLANT
POSITIONER SURGICAL ARM (MISCELLANEOUS) ×3 IMPLANT
SET HNDPC FAN SPRY TIP SCT (DISPOSABLE) ×1 IMPLANT
SPONGE GAUZE 2X2 STER 10/PKG (GAUZE/BANDAGES/DRESSINGS)
SPONGE SURGIFOAM ABS GEL 100 (HEMOSTASIS) IMPLANT
STAPLER VISISTAT (STAPLE) ×6 IMPLANT
STRIP CLOSURE SKIN 1/2X4 (GAUZE/BANDAGES/DRESSINGS) IMPLANT
SUT BONE WAX W31G (SUTURE) ×3 IMPLANT
SUT MNCRL AB 4-0 PS2 18 (SUTURE) ×3 IMPLANT
SUT STRATAFIX 0 PDS 27 VIOLET (SUTURE) ×3
SUT VIC AB 1 CT1 27 (SUTURE) ×6
SUT VIC AB 1 CT1 27XBRD ANTBC (SUTURE) ×3 IMPLANT
SUT VIC AB 2-0 CT1 27 (SUTURE) ×6
SUT VIC AB 2-0 CT1 TAPERPNT 27 (SUTURE) ×3 IMPLANT
SUTURE STRATFX 0 PDS 27 VIOLET (SUTURE) ×1 IMPLANT
SYR 50ML LL SCALE MARK (SYRINGE) IMPLANT
TOWER CARTRIDGE SMART MIX (DISPOSABLE) ×3 IMPLANT
TRAY FOLEY W/METER SILVER 16FR (SET/KITS/TRAYS/PACK) ×3 IMPLANT
WATER STERILE IRR 1500ML POUR (IV SOLUTION) ×3 IMPLANT
WRAP KNEE MAXI GEL POST OP (GAUZE/BANDAGES/DRESSINGS) ×3 IMPLANT
YANKAUER SUCT BULB TIP 10FT TU (MISCELLANEOUS) ×3 IMPLANT

## 2017-05-11 NOTE — Interval H&P Note (Signed)
History and Physical Interval Note:  05/11/2017 7:15 AM  Nathan Larson  has presented today for surgery, with the diagnosis of Degenerative joint disease left knee  The various methods of treatment have been discussed with the patient and family. After consideration of risks, benefits and other options for treatment, the patient has consented to  Procedure(s) with comments: LEFT TOTAL KNEE ARTHROPLASTY (Left) - 120 mins as a surgical intervention .  The patient's history has been reviewed, patient examined, no change in status, stable for surgery.  I have reviewed the patient's chart and labs.  Questions were answered to the patient's satisfaction.     Catarino Vold C

## 2017-05-11 NOTE — Discharge Instructions (Signed)

## 2017-05-11 NOTE — Progress Notes (Addendum)
Consult-Skilled Nursing Facility At this time per attending, the discharge plans is: Plan home with HHPT vs. straight to outpt PT post-op with family members at home for assistance. CSW will assist with SNF placement if needs arise.   Vivi Barrack, Theresia Majors, MSW Clinical Social Worker  [phone] 05/11/2017  12:02 PM

## 2017-05-11 NOTE — Anesthesia Procedure Notes (Signed)
Procedure Name: Intubation Date/Time: 05/11/2017 7:37 AM Performed by: Iyani Dresner, Virgel Gess Pre-anesthesia Checklist: Patient identified, Emergency Drugs available, Suction available, Patient being monitored and Timeout performed Patient Re-evaluated:Patient Re-evaluated prior to induction Oxygen Delivery Method: Circle system utilized Preoxygenation: Pre-oxygenation with 100% oxygen Induction Type: IV induction Ventilation: Mask ventilation without difficulty Laryngoscope Size: Mac and 4 Grade View: Grade III Tube type: Oral Tube size: 7.0 mm Number of attempts: 3 Airway Equipment and Method: Stylet Placement Confirmation: ETT inserted through vocal cords under direct vision,  positive ETCO2,  CO2 detector and breath sounds checked- equal and bilateral Secured at: 22 cm Tube secured with: Tape Dental Injury: Teeth and Oropharynx as per pre-operative assessment  Difficulty Due To: Difficulty was unanticipated and Difficult Airway- due to anterior larynx Comments: G3 with Mac 4 by Philip Eckersley CRNA, G3 with Sabra Heck 2 by Roanna Banning MD.  Jolyn Nap.  Easy pass, VSS, easy mask ventilation.

## 2017-05-11 NOTE — Anesthesia Preprocedure Evaluation (Addendum)
Anesthesia Evaluation  Patient identified by MRN, date of birth, ID band Patient awake    Reviewed: Allergy & Precautions, NPO status , Patient's Chart, lab work & pertinent test results  Airway Mallampati: II  TM Distance: >3 FB Neck ROM: Full    Dental no notable dental hx.    Pulmonary former smoker,    Pulmonary exam normal breath sounds clear to auscultation       Cardiovascular negative cardio ROS Normal cardiovascular exam Rhythm:Regular Rate:Normal     Neuro/Psych negative neurological ROS  negative psych ROS   GI/Hepatic negative GI ROS, Neg liver ROS,   Endo/Other  negative endocrine ROS  Renal/GU negative Renal ROS     Musculoskeletal  (+) Arthritis ,   Abdominal   Peds  Hematology negative hematology ROS (+)   Anesthesia Other Findings   Reproductive/Obstetrics                            Anesthesia Physical Anesthesia Plan  ASA: II  Anesthesia Plan: Spinal and General   Post-op Pain Management:  Regional for Post-op pain   Induction: Intravenous  PONV Risk Score and Plan: 2 and Ondansetron, Dexamethasone and Midazolam  Airway Management Planned: LMA  Additional Equipment:   Intra-op Plan:   Post-operative Plan: Extubation in OR  Informed Consent: I have reviewed the patients History and Physical, chart, labs and discussed the procedure including the risks, benefits and alternatives for the proposed anesthesia with the patient or authorized representative who has indicated his/her understanding and acceptance.   Dental advisory given  Plan Discussed with: CRNA  Anesthesia Plan Comments:        Anesthesia Quick Evaluation

## 2017-05-11 NOTE — Brief Op Note (Signed)
05/11/2017  9:33 AM  PATIENT:  Corrin Parker  61 y.o. male  PRE-OPERATIVE DIAGNOSIS:  Degenerative joint disease left knee  POST-OPERATIVE DIAGNOSIS:  Degenerative joint disease left knee  PROCEDURE:  Procedure(s) with comments: LEFT TOTAL KNEE ARTHROPLASTY (Left) - 120 mins with abductor block  SURGEON:  Surgeon(s) and Role:    Jene Every, MD - Primary  PHYSICIAN ASSISTANT:   ASSISTANTS: Bissell   ANESTHESIA:   general  EBL:  Total I/O In: 1000 [I.V.:1000] Out: 300 [Urine:250; Blood:50]  BLOOD ADMINISTERED:none  DRAINS: none   LOCAL MEDICATIONS USED:  MARCAINE     SPECIMEN:  No Specimen  DISPOSITION OF SPECIMEN:  N/A  COUNTS:  YES  TOURNIQUET:   Total Tourniquet Time Documented: Thigh (Left) - 66 minutes Total: Thigh (Left) - 66 minutes   DICTATION: .Other Dictation: Dictation Number [id number]  PLAN OF CARE: Admit to inpatient   PATIENT DISPOSITION:  PACU - hemodynamically stable.   Delay start of Pharmacological VTE agent (>24hrs) due to surgical blood loss or risk of bleeding: no

## 2017-05-11 NOTE — Anesthesia Procedure Notes (Addendum)
Anesthesia Regional Block: Adductor canal block   Pre-Anesthetic Checklist: ,, timeout performed, Correct Patient, Correct Site, Correct Laterality, Correct Procedure,, site marked, risks and benefits discussed, Surgical consent,  Pre-op evaluation,  At surgeon's request and post-op pain management  Laterality: Left  Prep: chloraprep       Needles:  Injection technique: Single-shot  Needle Type: Echogenic Stimulator Needle     Needle Length: 9cm  Needle Gauge: 21     Additional Needles:   Procedures:,,,, ultrasound used (permanent image in chart),,,,  Narrative:  Start time: 05/11/2017 7:05 AM End time: 05/11/2017 7:15 AM Injection made incrementally with aspirations every 5 mL.  Performed by: Personally  Anesthesiologist: Karna Christmas P  Additional Notes: Functioning IV was confirmed and monitors were applied.  A 90mm 21ga Arrow echogenic stimulator needle was used. Sterile prep, hand hygiene and sterile gloves were used.  Negative aspiration and negative test dose prior to incremental administration of local anesthetic. The patient tolerated the procedure well.

## 2017-05-11 NOTE — Anesthesia Postprocedure Evaluation (Signed)
Anesthesia Post Note  Patient: Nathan Larson  Procedure(s) Performed: LEFT TOTAL KNEE ARTHROPLASTY (Left Knee)     Patient location during evaluation: PACU Anesthesia Type: Regional and General Level of consciousness: awake and alert Pain management: pain level controlled Vital Signs Assessment: post-procedure vital signs reviewed and stable Respiratory status: spontaneous breathing, nonlabored ventilation, respiratory function stable and patient connected to nasal cannula oxygen Cardiovascular status: blood pressure returned to baseline and stable Postop Assessment: no apparent nausea or vomiting Anesthetic complications: no    Last Vitals:  Vitals:   05/11/17 1112 05/11/17 1223  BP: 132/63 119/82  Pulse: 90 84  Resp: 16 14  Temp: 36.5 C 36.7 C  SpO2: 97% 98%    Last Pain:  Vitals:   05/11/17 1223  TempSrc: Oral  PainSc:                  Catheryn Bacon Ellender

## 2017-05-11 NOTE — Evaluation (Signed)
Physical Therapy Evaluation Patient Details Name: Nathan Larson MRN: [id number] DOB: 1956/06/05 Today's Date: 05/11/2017   History of Present Illness  Pt s/p L TKR   Clinical Impression  Pt s/p L TKR and presents with decreased L LE strength/ROM and post op pain limiting functional mobility.  Pt should progress well to dc home with family assist.    Follow Up Recommendations Home health PT    Equipment Recommendations  None recommended by PT    Recommendations for Other Services OT consult     Precautions / Restrictions Precautions Precautions: Knee;Fall Required Braces or Orthoses: Knee Immobilizer - Left Knee Immobilizer - Left: Discontinue once straight leg raise with < 10 degree lag Restrictions Weight Bearing Restrictions: No Other Position/Activity Restrictions: WBAT      Mobility  Bed Mobility Overal bed mobility: Needs Assistance Bed Mobility: Supine to Sit     Supine to sit: Min assist     General bed mobility comments: cues for sequence and use of R LE to self assist  Transfers Overall transfer level: Needs assistance Equipment used: Rolling walker (2 wheeled) Transfers: Sit to/from Stand Sit to Stand: Min assist         General transfer comment: cues for LE management and use of UEs to self assist  Ambulation/Gait Ambulation/Gait assistance: Min assist Ambulation Distance (Feet): 45 Feet Assistive device: Rolling walker (2 wheeled) Gait Pattern/deviations: Step-to pattern;Decreased step length - right;Decreased step length - left;Shuffle;Trunk flexed Gait velocity: decr Gait velocity interpretation: Below normal speed for age/gender General Gait Details: cues for posture, position from RW and sequence  Stairs            Wheelchair Mobility    Modified Rankin (Stroke Patients Only)       Balance                                             Pertinent Vitals/Pain Pain Assessment: 0-10 Pain Score: 4  Pain  Location: L knee Pain Descriptors / Indicators: Aching;Sore Pain Intervention(s): Limited activity within patient's tolerance;Monitored during session;Premedicated before session;Ice applied    Home Living Family/patient expects to be discharged to:: Private residence Living Arrangements: Spouse/significant other Available Help at Discharge: Family Type of Home: House Home Access: Stairs to enter   Secretary/administrator of Steps: 1 Home Layout: One level Home Equipment: Environmental consultant - 2 wheels      Prior Function Level of Independence: Independent               Hand Dominance        Extremity/Trunk Assessment   Upper Extremity Assessment Upper Extremity Assessment: Overall WFL for tasks assessed    Lower Extremity Assessment Lower Extremity Assessment: LLE deficits/detail    Cervical / Trunk Assessment Cervical / Trunk Assessment: Normal  Communication   Communication: No difficulties  Cognition Arousal/Alertness: Awake/alert Behavior During Therapy: WFL for tasks assessed/performed Overall Cognitive Status: Within Functional Limits for tasks assessed                                        General Comments      Exercises Total Joint Exercises Ankle Circles/Pumps: AROM;Both;15 reps;Supine   Assessment/Plan    PT Assessment Patient needs continued PT services  PT Problem List Decreased strength;Decreased range of motion;Decreased  activity tolerance;Decreased mobility;Decreased knowledge of use of DME;Pain       PT Treatment Interventions DME instruction;Gait training;Stair training;Functional mobility training;Therapeutic activities;Therapeutic exercise;Patient/family education    PT Goals (Current goals can be found in the Care Plan section)  Acute Rehab PT Goals Patient Stated Goal: Regain IND and get back to playing golf PT Goal Formulation: With patient Time For Goal Achievement: 05/14/17 Potential to Achieve Goals: Good     Frequency 7X/week   Barriers to discharge        Co-evaluation               AM-PAC PT "6 Clicks" Daily Activity  Outcome Measure Difficulty turning over in bed (including adjusting bedclothes, sheets and blankets)?: A Lot Difficulty moving from lying on back to sitting on the side of the bed? : A Lot Difficulty sitting down on and standing up from a chair with arms (e.g., wheelchair, bedside commode, etc,.)?: A Lot Help needed moving to and from a bed to chair (including a wheelchair)?: A Little Help needed walking in hospital room?: A Little Help needed climbing 3-5 steps with a railing? : A Little 6 Click Score: 15    End of Session Equipment Utilized During Treatment: Gait belt;Left knee immobilizer Activity Tolerance: Patient tolerated treatment well Patient left: in chair;with call bell/phone within reach Nurse Communication: Mobility status PT Visit Diagnosis: Difficulty in walking, not elsewhere classified (R26.2)    Time: 6045-4098 PT Time Calculation (min) (ACUTE ONLY): 27 min   Charges:   PT Evaluation $PT Eval Low Complexity: 1 Low PT Treatments $Gait Training: 8-22 mins   PT G Codes:        Pg [phone]   Natia Fahmy 05/11/2017, 6:31 PM

## 2017-05-11 NOTE — H&P (View-Only) (Signed)
Nathan Larson DOB: 1955-11-10 Married / Language: [place] / Race: White Male  H&P Date:  05/03/17  CC: Left knee pain  History of Present Illness The patient is a 61 year old male who comes in today for a preoperative History and Physical. The patient is scheduled for a left total knee arthroplasty to be performed by Dr. Javier Docker, MD at [facility] on 05/11/17 . [name] reports progressively worsening chronic pain bilateral knees, left worse than right, with prior hx of arthroscopies, refractory to PT/HEP, quad strengthening, activity modifications, relative rest, NSAIDs. He reports weightbearing pain and instability interfering with ADLs and quality of life at this point. He desires to proceed with surgery.  Dr. Shelle Iron and the patient mutually agreed to proceed with a left total knee replacement. Risks and benefits of the procedure were discussed including stiffness, suboptimal range of motion, persistent pain, infection requiring removal of prosthesis and reinsertion, need for prophylactic antibiotics in the future, for example, dental procedures, possible need for manipulation, revision in the future and also anesthetic complications including DVT, PE, etc. We discussed the perioperative course, time in the hospital, postoperative recovery and the need for elevation to control swelling. We also discussed the predicted range of motion and the probability that squatting and kneeling would be unobtainable in the future. In addition, postoperative anticoagulation was discussed. We have obtained preoperative medical clearance as necessary. Provided illustrated handout and discussed it in detail. They will enroll in the total joint replacement educational forum at the hospital.  [facility] pre-op appt already completed earlier this week.  Problem List/Past Medical Hx Chronic pain of both knees (M25.561, M25.562)  Primary osteoarthritis of left knee (M17.12)   Allergies  No Known Drug  Allergies [03/07/2017]:  Family History Congestive Heart Failure  Father. Diabetes Mellitus  Mother, Sister. Drug / Alcohol Addiction  Father. Heart Disease  Father. First Degree Relatives   Social History Tobacco use  Former smoker. 03/07/2017: smoke(d) 1/2 pack(s) per day Children  2 Current drinker  03/07/2017: Currently drinks beer only occasionally per week Current work status  working part time Exercise  Exercises weekly; does other Living situation  live with spouse, 2 level house, can stay on 1st floor post-op Marital status  married No history of drug/alcohol rehab  Not under pain contract  Number of flights of stairs before winded  2-3 Tobacco / smoke exposure  03/07/2017: no Post-Surgical Plans  home with HHPT vs. outpt PT  Medication History  Glucosamine Chondr 500 Complex (Oral) Specific strength unknown - Active. Aspirin (  Tablet, Oral) Active. Multivitamin (Oral) Active. Medications Reconciled  Past Surgical History  Arthroscopy of Knee  bilateral Colon Polyp Removal - Colonoscopy   Review of Systems General Not Present- Chills, Fatigue, Fever, Memory Loss, Night Sweats, Weight Gain and Weight Loss. Skin Not Present- Eczema, Hives, Itching, Lesions and Rash. HEENT Not Present- Dentures, Double Vision, Headache, Hearing Loss, Tinnitus and Visual Loss. Respiratory Not Present- Allergies, Chronic Cough, Coughing up blood, Shortness of breath at rest and Shortness of breath with exertion. Cardiovascular Not Present- Chest Pain, Difficulty Breathing Lying Down, Murmur, Palpitations, Racing/skipping heartbeats and Swelling. Gastrointestinal Not Present- Abdominal Pain, Bloody Stool, Constipation, Diarrhea, Difficulty Swallowing, Heartburn, Jaundice, Loss of appetitie, Nausea and Vomiting. Male Genitourinary Not Present- Blood in Urine, Discharge, Flank Pain, Incontinence, Painful Urination, Urgency, Urinary frequency, Urinary Retention,  Urinating at Night and Weak urinary stream. Musculoskeletal Present- Joint Pain, Joint Stiffness and Morning Stiffness. Not Present- Back Pain, Joint Swelling, Muscle  Pain, Muscle Weakness and Spasms. Neurological Not Present- Blackout spells, Difficulty with balance, Dizziness, Paralysis, Tremor and Weakness. Psychiatric Not Present- Insomnia.  Physical Exam General Mental Status -Alert, cooperative and good historian. General Appearance-pleasant, Not in acute distress. Orientation-Oriented X3. Build & Nutrition-Well nourished and Well developed.  Head and Neck Head-normocephalic, atraumatic . Neck Global Assessment - supple, no bruit auscultated on the right, no bruit auscultated on the left.  Eye Pupil - Bilateral-Regular and Round. Motion - Bilateral-EOMI.  Chest and Lung Exam Auscultation Breath sounds - clear at anterior chest wall and clear at posterior chest wall. Adventitious sounds - No Adventitious sounds.  Cardiovascular Auscultation Rhythm - Regular rate and rhythm. Heart Sounds - S1 WNL and S2 WNL. Murmurs & Other Heart Sounds - Auscultation of the heart reveals - No Murmurs.  Abdomen Palpation/Percussion Tenderness - Abdomen is non-tender to palpation. Rigidity (guarding) - Abdomen is soft. Auscultation Auscultation of the abdomen reveals - Bowel sounds normal.  Male Genitourinary Not done, not pertinent to present illness  Musculoskeletal On exam, I see a very healthy, well-fit male. He walks with an antalgic gait and a slight varus thrust in the knees, right greater than left. Exquisitely tender in the medial joint line bilaterally. He has patellofemoral pain with compression bilaterally. His range is 0 to 130. He has trace effusion.  Knee exam on inspection reveals no evidence of soft tissue swelling, ecchymosis, deformity or erythema. On palpation there is no tenderness in the lateral joint line. No patellofemoral pain with compression.  Nontender over the fibular head or the peroneal nerve. Nontender over the quadriceps insertion of the patellar ligament insertion. The range of motion was full. Provocative maneuvers revealed a negative Lachman, negative anterior and posterior drawer and a negative McMurray. No instability was noted with varus and valgus stressing at 0 or 30 degrees. On manual motor test the quadriceps and hamstrings were 5/5. Sensory exam was intact to light touch.  Imaging AP, standing and lateral Merchant demonstrates bone-on-bone arthrosis, medial compartment with varus deformity. He has patellofemoral and lateral compartment changes as well.  Assessment & Plan  Primary osteoarthritis of left knee (M17.12)  Pt with end-stage B/L knee DJD, bone-on-bone, refractory to conservative tx, scheduled for left total knee replacement by Dr. Shelle Iron. We again discussed the procedure itself as well as risks, complications and alternatives, including but not limited to DVT, PE, infx, bleeding, failure of procedure, need for secondary procedure including manipulation, nerve injury, ongoing pain/symptoms, anesthesia risk, even stroke or death. Also discussed typical post-op protocols, activity restrictions, need for PT, flexion/extension exercises, time out of work. Discussed need for DVT ppx post-op per protocol. Discussed dental ppx and infx prevention. Also discussed limitations post-operatively such as kneeling and squatting. All questions were answered. Patient desires to proceed with surgery as scheduled. Will hold supplements, vitamins, ASA and NSAIDs accordingly. Will remain NPO after MN night before surgery. We reviewed his pre-op labs. Anticipate hospital stay to include at least 2 midnights given medical history and to ensure proper pain control. Plan ASA  BID for DVT ppx post-op. Plan pain medication, Robaxin, Colace, Miralax and ASA post-op. Plan home with HHPT vs. straight to outpt PT post-op with family members at  home for assistance. Will follow up 10-14 days post-op for staple removal and xrays.  He also plans to proceed with the right knee prior to the end of the year. We discussed at least 6 weeks would be recommended between left and right knee.  Plan LEFT  total knee replacement  Signed electronically by Dorothy Spark, PA-C for Dr. Shelle Iron

## 2017-05-11 NOTE — Transfer of Care (Signed)
Immediate Anesthesia Transfer of Care Note  Patient: Nathan Larson  Procedure(s) Performed: Procedure(s) with comments: LEFT TOTAL KNEE ARTHROPLASTY (Left) - 120 mins with abductor block  Patient Location: PACU  Anesthesia Type:GA combined with regional for post-op pain  Level of Consciousness:  sedated, patient cooperative and responds to stimulation  Airway & Oxygen Therapy:Patient Spontanous Breathing and Patient connected to face mask oxgen  Post-op Assessment:  Report given to PACU RN and Post -op Vital signs reviewed and stable  Post vital signs:  Reviewed and stable  Last Vitals:  Vitals:   05/11/17 0551  BP: 125/70  Pulse: 68  Resp: 16  Temp: 36.7 C  SpO2: 98%    Complications: No apparent anesthesia complications

## 2017-05-12 ENCOUNTER — Encounter (HOSPITAL_COMMUNITY): Payer: Self-pay | Admitting: Specialist

## 2017-05-12 LAB — CBC
HEMATOCRIT: 39.8 % (ref 39.0–52.0)
Hemoglobin: 13.2 g/dL (ref 13.0–17.0)
MCH: 29.6 pg (ref 26.0–34.0)
MCHC: 33.2 g/dL (ref 30.0–36.0)
MCV: 89.2 fL (ref 78.0–100.0)
PLATELETS: 236 10*3/uL (ref 150–400)
RBC: 4.46 MIL/uL (ref 4.22–5.81)
RDW: 13 % (ref 11.5–15.5)
WBC: 12.5 10*3/uL — ABNORMAL HIGH (ref 4.0–10.5)

## 2017-05-12 LAB — BASIC METABOLIC PANEL
ANION GAP: 12 (ref 5–15)
BUN: 11 mg/dL (ref 6–20)
CHLORIDE: 104 mmol/L (ref 101–111)
CO2: 24 mmol/L (ref 22–32)
Calcium: 9 mg/dL (ref 8.9–10.3)
Creatinine, Ser: 0.92 mg/dL (ref 0.61–1.24)
GFR calc Af Amer: >60 mL/min (ref >60)
GLUCOSE: 111 mg/dL — AB (ref 65–99)
POTASSIUM: 4 mmol/L (ref 3.5–5.1)
Sodium: 140 mmol/L (ref 135–145)

## 2017-05-12 NOTE — Evaluation (Signed)
Occupational Therapy Evaluation Patient Details Name: Nathan Larson MRN: [id number] DOB: [birth year] Today's Date: 05/12/2017    History of Present Illness Pt s/p L TKR    Clinical Impression   This 61 year old man was admitted for the above sx. All education was completed. No further OT is needed at this time    Follow Up Recommendations  Supervision/Assistance - 24 hour    Equipment Recommendations  None recommended by OT (pt will get shower seat himself)    Recommendations for Other Services       Precautions / Restrictions Precautions Precautions: Knee;Fall Required Braces or Orthoses: Knee Immobilizer - Left Knee Immobilizer - Left: Discontinue once straight leg raise with < 10 degree lag Restrictions Weight Bearing Restrictions: No      Mobility Bed Mobility Overal bed mobility: Modified Independent                Transfers   Equipment used: Rolling walker (2 wheeled)   Sit to Stand: Supervision              Balance                                           ADL either performed or assessed with clinical judgement   ADL Overall ADL's : Needs assistance/impaired Eating/Feeding: Independent   Grooming: Brushing hair;Supervision/safety;Standing   Upper Body Bathing: Set up;Sitting   Lower Body Bathing: Minimal assistance;Sit to/from stand   Upper Body Dressing : Set up;Sitting   Lower Body Dressing: Moderate assistance;Sit to/from stand   Toilet Transfer: Supervision/safety;Ambulation;RW (chair)   Toileting- Clothing Manipulation and Hygiene: Supervision/safety;Sit to/from stand   Tub/ Shower Transfer: Walk-in shower;Supervision/safety;Ambulation     General ADL Comments: performed adl from standing at sink.  Practiced shower transfer; he will get a seat himself.  Pt feels he can perform toilet transfer.  He has one toilet with grab bar in front of it     Vision         Perception     Praxis      Pertinent  Vitals/Pain Pain Score: 4  Pain Location: L knee Pain Descriptors / Indicators: Aching;Sore Pain Intervention(s): Monitored during session;Limited activity within patient's tolerance;Repositioned (asked NT to bring new ice)     Hand Dominance     Extremity/Trunk Assessment Upper Extremity Assessment Upper Extremity Assessment: Overall WFL for tasks assessed           Communication Communication Communication: No difficulties   Cognition Arousal/Alertness: Awake/alert Behavior During Therapy: WFL for tasks assessed/performed Overall Cognitive Status: Within Functional Limits for tasks assessed                                     General Comments       Exercises     Shoulder Instructions      Home Living Family/patient expects to be discharged to:: Private residence Living Arrangements: Spouse/significant other Available Help at Discharge: Family               Bathroom Shower/Tub: Walk-in Human resources officer: Standard (16")     Home Equipment: Environmental consultant - 2 wheels   Additional Comments: pt will get a shower seat himself      Prior Functioning/Environment Level of Independence: Independent  OT Problem List:        OT Treatment/Interventions:      OT Goals(Current goals can be found in the care plan section) Acute Rehab OT Goals Patient Stated Goal: Regain IND and get back to playing golf OT Goal Formulation: All assessment and education complete, DC therapy  OT Frequency:     Barriers to D/C:            Co-evaluation              AM-PAC PT "6 Clicks" Daily Activity     Outcome Measure Help from another person eating meals?: None Help from another person taking care of personal grooming?: A Little Help from another person toileting, which includes using toliet, bedpan, or urinal?: A Little Help from another person bathing (including washing, rinsing, drying)?: A Little Help from another person to  put on and taking off regular upper body clothing?: A Little Help from another person to put on and taking off regular lower body clothing?: A Lot 6 Click Score: 18   End of Session    Activity Tolerance: Patient tolerated treatment well Patient left: in chair;with call bell/phone within reach  OT Visit Diagnosis: Pain Pain - Right/Left: Left Pain - part of body: Knee                Time: 1610-9604 OT Time Calculation (min): 22 min Charges:  OT General Charges $OT Visit: 1 Visit OT Evaluation $OT Eval Low Complexity: 1 Low G-Codes:     [place], OTR/L [phone] 05/12/2017  Leone Putman 05/12/2017, 9:09 AM

## 2017-05-12 NOTE — Progress Notes (Signed)
Patient ID: Nathan Larson, male   DOB: August 05, 1955, 61 y.o.   MRN: [id number] Subjective: 1 Day Post-Op Procedure(s) (LRB): LEFT TOTAL KNEE ARTHROPLASTY (Left) Patient reports pain as mild.    Patient with mild L knee pain this AM. Foley removed this AM has not voided yet. No other c/o. Denies numbness, tingling, N/V.  We will start therapy today. Plan is to go home with HHPT after hospital stay.  Objective: Vital signs in last 24 hours: Temp:  [97.7 F (36.5 C)-98.7 F (37.1 C)] 98.4 F (36.9 C) (10/12 0458) Pulse Rate:  [58-118] 73 (10/12 0458) Resp:  [10-19] 16 (10/12 0458) BP: (109-139)/(52-85) 121/55 (10/12 0458) SpO2:  [93 %-100 %] 96 % (10/12 0458)  Intake/Output from previous day:  Intake/Output Summary (Last 24 hours) at 05/12/17 0910 Last data filed at 05/12/17 0805  Gross per 24 hour  Intake          3675.83 ml  Output             4050 ml  Net          -374.17 ml    Intake/Output this shift: Total I/O In: 240 [P.O.:240] Out: -   Labs: Results for orders placed or performed during the hospital encounter of 05/11/17  CBC  Result Value Ref Range   WBC 12.5 (H) 4.0 - 10.5 K/uL   RBC 4.46 4.22 - 5.81 MIL/uL   Hemoglobin 13.2 13.0 - 17.0 g/dL   HCT 69.6 29.5 - 28.4 %   MCV 89.2 78.0 - 100.0 fL   MCH 29.6 26.0 - 34.0 pg   MCHC 33.2 30.0 - 36.0 g/dL   RDW 13.2 44.0 - 10.2 %   Platelets 236 150 - 400 K/uL  Basic metabolic panel  Result Value Ref Range   Sodium 140 135 - 145 mmol/L   Potassium 4.0 3.5 - 5.1 mmol/L   Chloride 104 101 - 111 mmol/L   CO2 24 22 - 32 mmol/L   Glucose, Bld 111 (H) 65 - 99 mg/dL   BUN 11 6 - 20 mg/dL   Creatinine, Ser 7.25 0.61 - 1.24 mg/dL   Calcium 9.0 8.9 - 36.6 mg/dL   GFR calc non Af Amer >60 >60 mL/min   GFR calc Af Amer >60 >60 mL/min   Anion gap 12 5 - 15    Exam - Neurologically intact ABD soft Neurovascular intact Sensation intact distally Intact pulses distally Dorsiflexion/Plantar flexion intact Incision:  dressing C/D/I and no drainage No cellulitis present Compartment soft no calf pain or sign of DVT Dressing - clean, dry, no drainage Motor function intact - moving foot and toes well on exam.   Assessment/Plan: 1 Day Post-Op Procedure(s) (LRB): LEFT TOTAL KNEE ARTHROPLASTY (Left)  Advance diet Up with therapy D/C IV fluids Past Medical History:  Diagnosis Date  . Arthritis   . Heart murmur    As a child    DVT Prophylaxis - ASA  BID Protocol Weight-Bearing as tolerated to Left leg No vaccines Voiding trial, if unable to void in 6 hrs from foley removal, bladder scan and straight cath prn Up with PT today Anticipate D/C home tomorrow with HHPT Will discuss with Dr. Elissa Lovett, Dayna Barker. 05/12/2017, 9:10 AM

## 2017-05-12 NOTE — Op Note (Signed)
NAME:  Nathan Larson, Nathan Larson                   ACCOUNT NO.:  MEDICAL RECORD NO.:  [id number]  LOCATION:                                 FACILITY:  PHYSICIAN:  Jene Every, M.D.         DATE OF BIRTH:  DATE OF PROCEDURE:  05/11/2017 DATE OF DISCHARGE:                              OPERATIVE REPORT   PREOPERATIVE DIAGNOSIS:  End-stage osteoarthrosis, varus deformity of the left knee.  POSTOPERATIVE DIAGNOSIS:  End-stage osteoarthrosis, varus deformity of the left knee.  PROCEDURE PERFORMED:  Left total knee arthroplasty utilizing DePuy Attune, 8 femur, 8 tibia, 7-mm insert, 41 patella.  ANESTHESIA:  General.  ASSISTANT:  Lanna Poche, PA.  HISTORY:  A 61 year old male with bone-on-bone arthrosis, varus deformity of the left knee with negative affect to his activities of daily living, refractory to conservative treatment, indicated for replacement of the degenerated joint.  Risks and benefits discussed including bleeding, infection, damage to the neurovascular structures, no change in symptoms, worsening symptoms, DVT, PE, anesthetic complications, etc.  TECHNIQUE:  With the patient in supine position after the induction of adequate general anesthesia and adductor block, left lower extremity was prepped and draped and exsanguinated in usual sterile fashion.  Thigh tourniquet inflated to 250 mmHg.  Standard midline incision was made over the patella.  Full-thickness flaps developed.  Median parapatellar arthrotomy performed.  The patient had prior to surgery encountered some scar tissue.  Everted the patella.  Knee flexed.  Tricompartmental severe osteoarthrosis was noted.  A synovectomy was performed.  Removed osteophytes with a rongeur.  Remnants of the medial and lateral menisci were removed.  Cauterized the geniculates.  Notch was made in insertion of the PCL.  Step drill utilized, entered the femoral canal in the appropriate angle.  It was irrigated.  T-handle, then a  5-degree left with 9 off the distal femur was selected.  This was then pinned and we performed our distal femoral cut.  Unable to sublux the tibia; therefore, completed the cut on the femur.  It was sized to an 8 off the lateral ridge.  This was a 5-degree left.  This was then pinned and measured off the anterior cortex, 3 degrees of external rotation.  We then put our block and performed our anterior, posterior and chamfer cuts protecting the soft tissues at all times with curved Cregos.  Next, we were able to sublux the tibia, remove the ACL.  Bone was noted and defect posteromedially.  External alignment guide, 2 off the defect, which was posteromedially, 3-degree slope bisecting the tibiotalar joint.  Parallel to the shaft, this was then pinned.  Two off the defect, turned to be 10 off the lateral side and appropriate alignment. We performed our cut and then checked our extension gap, which was satisfactory with a 6-mm insert.  Next, flexed the knee, completed the tibial preparation.  The tray of 8 was satisfactory.  Distal medial aspect of the tibial tubercle was pinned, drilled it centrally and then used our punch guide.  We turned our attention back.  Prior to this, we harvested the bone from the proximal tibia to fill our notch.  Back  to the femur, we used our box cut guide bisecting the canal.  This was pinned, we performed our cut. We placed a 5 trial femur and drilled our lug holes.  This was an 8 and an 8 of tibia, we placed a 6-mm insert and had full extension and full flexion.  Slight recurvatum and full extension.  Negative anterior drawer.  Excellent patellofemoral tracking.  Then, we prepared the patella, measured at 24 thickness, planed it to the 15, utilizing the patellar jig parallel.  With an oscillating saw, measured at 15. Following that, we drilled our peg holes medializing those, retracting the patella, measured it at 41, used our template, reduced it, and  the template paddle was parallel to the joint surface.  Used the trial 41, reduced it, had excellent patellofemoral tracking, and full extension and full flexion and good stability with varus-valgus stressing at 0-30 degrees and negative anterior drawer.  We removed all trials, checked posteriorly, had remnants of the lateral meniscus.  This was excised preserving the popliteus.  The capsule was intact.  The Cregos throughout the case were gently placed posteriorly again protecting the neurovascular structures at all times.  Pulsatile lavage to clean the joint, knee was flexed, all surfaces thoroughly dried.  Mixed cement on the back table in appropriate fashion and then injected into the proximal tibia, digitally pressurizing it.  We then cemented and impacted our tibial tray, redundant cement removed.  We cemented and impacted our femur, redundant cement removed using appropriate cement technique.  I placed a 7-mm insert and reduced it, held an axial load throughout the curing of the cement.  We then cemented and clamped our patella.  I then injected 0.25% Marcaine and epinephrine in the periosteum of the femur in the periarticular tissues.  We covered the wound with antibiotic irrigation and allowed appropriate curing of the cement, and then following this, we released the tourniquet at 65 minutes.  Any bleeding was cauterized.  We then tested our knee.  We had excellent stability, full range and excellent patellofemoral tracking. I removed the trial 7, flexed the knee, meticulously removed all redundant cement.  Copiously irrigated the knee, selected our 7 permanent insert, copiously irrigated the wound with antibiotic irrigation, placed our permanent insert, reduced it, towel clips had excellent patellofemoral tracking, full extension, full flexion, negative anterior drawer.  Then with slight flexion, reapproximated the patellar arthrotomy with #1 Vicryl interrupted figure-of-eight  sutures and then with a running Stratafix.  Then, again, we had full flexion and full extension, good stability, varus-valgus stressing at 0 to 30 degrees, negative anterior drawer and excellent patellofemoral tracking. Copiously irrigated the wound once again and closed the subcu with 2 and skin with Monocryl.  I had flexion to gravity at 90 degrees and excellent patellofemoral tracking.  The wound was then dressed sterilely, placed in immobilizer, extubated without difficulty and transported to the recovery room in satisfactory condition.  The patient tolerated the procedure well.  No complications.  Assistant, Lanna Poche, was used throughout the case for patient positioning, gentle intermittent traction and closure.  Minimal blood loss.     Jene Every, M.D.     Cordelia Pen  D:  05/11/2017  T:  05/11/2017  Job:  [id number]

## 2017-05-12 NOTE — Progress Notes (Signed)
Spoke with pt concerning HH, pt states he is coming OP PT with no DME needs at present time. Pt have RW at home.

## 2017-05-12 NOTE — Progress Notes (Signed)
Physical Therapy Treatment Patient Details Name: Nathan Larson MRN: [id number] DOB: 05/18/1956 Today's Date: 05/12/2017    History of Present Illness Pt s/p L TKR     PT Comments    Pt motivated and progressing well with mobility.   Follow Up Recommendations  Home health PT     Equipment Recommendations  None recommended by PT    Recommendations for Other Services OT consult     Precautions / Restrictions Precautions Precautions: Knee;Fall Required Braces or Orthoses: Knee Immobilizer - Left Knee Immobilizer - Left: Discontinue once straight leg raise with < 10 degree lag (Pt performed IND SLR this am) Restrictions Weight Bearing Restrictions: No Other Position/Activity Restrictions: WBAT    Mobility  Bed Mobility Overal bed mobility: Modified Independent                Transfers Overall transfer level: Needs assistance Equipment used: Rolling walker (2 wheeled) Transfers: Sit to/from Stand Sit to Stand: Supervision         General transfer comment: cues for LE management and use of UEs to self assist  Ambulation/Gait Ambulation/Gait assistance: Min guard Ambulation Distance (Feet): 111 Feet Assistive device: Rolling walker (2 wheeled) Gait Pattern/deviations: Step-to pattern;Decreased step length - right;Decreased step length - left;Shuffle;Trunk flexed Gait velocity: decr Gait velocity interpretation: Below normal speed for age/gender General Gait Details: cues for posture, position from RW and sequence   Stairs            Wheelchair Mobility    Modified Rankin (Stroke Patients Only)       Balance                                            Cognition Arousal/Alertness: Awake/alert Behavior During Therapy: WFL for tasks assessed/performed Overall Cognitive Status: Within Functional Limits for tasks assessed                                        Exercises Total Joint Exercises Ankle  Circles/Pumps: AROM;Both;15 reps;Supine Quad Sets: AROM;Both;10 reps;Supine Heel Slides: AAROM;Left;15 reps;Supine Straight Leg Raises: AAROM;AROM;Left;15 reps;Supine Goniometric ROM: AAROM L knee -10- 90    General Comments        Pertinent Vitals/Pain Pain Assessment: 0-10 Pain Score: 4  Pain Location: L knee Pain Descriptors / Indicators: Aching;Sore Pain Intervention(s): Limited activity within patient's tolerance;Monitored during session;Premedicated before session;Ice applied    Home Living Family/patient expects to be discharged to:: Private residence Living Arrangements: Spouse/significant other Available Help at Discharge: Family         Home Equipment: Dan Humphreys - 2 wheels Additional Comments: pt will get a shower seat himself    Prior Function Level of Independence: Independent          PT Goals (current goals can now be found in the care plan section) Acute Rehab PT Goals Patient Stated Goal: Regain IND and get back to playing golf PT Goal Formulation: With patient Time For Goal Achievement: 05/14/17 Potential to Achieve Goals: Good Progress towards PT goals: Progressing toward goals    Frequency    7X/week      PT Plan Current plan remains appropriate    Co-evaluation              AM-PAC PT "6 Clicks" Daily Activity  Outcome Measure  Difficulty turning over in bed (including adjusting bedclothes, sheets and blankets)?: A Lot Difficulty moving from lying on back to sitting on the side of the bed? : A Little Difficulty sitting down on and standing up from a chair with arms (e.g., wheelchair, bedside commode, etc,.)?: A Little Help needed moving to and from a bed to chair (including a wheelchair)?: A Little Help needed walking in hospital room?: A Little Help needed climbing 3-5 steps with a railing? : A Little 6 Click Score: 17    End of Session Equipment Utilized During Treatment: Gait belt Activity Tolerance: Patient tolerated treatment  well Patient left: in chair;with call bell/phone within reach Nurse Communication: Mobility status PT Visit Diagnosis: Difficulty in walking, not elsewhere classified (R26.2)     Time: 1610-9604 PT Time Calculation (min) (ACUTE ONLY): 24 min  Charges:  $Gait Training: 8-22 mins $Therapeutic Exercise: 8-22 mins                    G Codes:       Pg [phone]    Nathan Larson 05/12/2017, 9:49 AM

## 2017-05-12 NOTE — Progress Notes (Signed)
Physical Therapy Treatment Patient Details Name: Nathan Larson MRN: [id number] DOB: 1955-10-01 Today's Date: 05/12/2017    History of Present Illness Pt s/p L TKR     PT Comments    Pt continues motivated but ltd this pm by dizziness with ambulation - BP 78/36 - RN aware.   Follow Up Recommendations  Home health PT     Equipment Recommendations  None recommended by PT    Recommendations for Other Services OT consult     Precautions / Restrictions Precautions Precautions: Knee;Fall Required Braces or Orthoses: Knee Immobilizer - Left Knee Immobilizer - Left: Discontinue once straight leg raise with < 10 degree lag Restrictions Weight Bearing Restrictions: No Other Position/Activity Restrictions: WBAT    Mobility  Bed Mobility Overal bed mobility: Modified Independent Bed Mobility: Supine to Sit              Transfers Overall transfer level: Needs assistance Equipment used: Rolling walker (2 wheeled) Transfers: Sit to/from Stand Sit to Stand: Supervision;Min assist         General transfer comment: cues for LE management and use of UEs to self assist  Ambulation/Gait Ambulation/Gait assistance: Min guard Ambulation Distance (Feet): 28 Feet Assistive device: Rolling walker (2 wheeled) Gait Pattern/deviations: Step-to pattern;Decreased step length - right;Decreased step length - left;Shuffle;Trunk flexed Gait velocity: decr Gait velocity interpretation: Below normal speed for age/gender General Gait Details: cues for posture, position from RW and sequence   Stairs            Wheelchair Mobility    Modified Rankin (Stroke Patients Only)       Balance                                            Cognition Arousal/Alertness: Awake/alert Behavior During Therapy: WFL for tasks assessed/performed Overall Cognitive Status: Within Functional Limits for tasks assessed                                         Exercises Total Joint Exercises Ankle Circles/Pumps: AROM;Both;15 reps;Supine Quad Sets: AROM;Both;10 reps;Supine Heel Slides: AAROM;Left;15 reps;Supine Straight Leg Raises: AAROM;AROM;Left;15 reps;Supine    General Comments        Pertinent Vitals/Pain Pain Assessment: 0-10 Pain Score: 4  Pain Location: L knee Pain Descriptors / Indicators: Aching;Sore Pain Intervention(s): Limited activity within patient's tolerance;Premedicated before session;Monitored during session;Ice applied    Home Living                      Prior Function            PT Goals (current goals can now be found in the care plan section) Acute Rehab PT Goals Patient Stated Goal: Regain IND and get back to playing golf PT Goal Formulation: With patient Time For Goal Achievement: 05/14/17 Potential to Achieve Goals: Good Progress towards PT goals: Progressing toward goals    Frequency    7X/week      PT Plan Current plan remains appropriate    Co-evaluation              AM-PAC PT "6 Clicks" Daily Activity  Outcome Measure  Difficulty turning over in bed (including adjusting bedclothes, sheets and blankets)?: A Lot Difficulty moving from lying on back to sitting on  the side of the bed? : A Little Difficulty sitting down on and standing up from a chair with arms (e.g., wheelchair, bedside commode, etc,.)?: A Little Help needed moving to and from a bed to chair (including a wheelchair)?: A Little Help needed walking in hospital room?: A Little Help needed climbing 3-5 steps with a railing? : A Little 6 Click Score: 17    End of Session Equipment Utilized During Treatment: Gait belt Activity Tolerance: Patient limited by fatigue Patient left: with call bell/phone within reach;in bed Nurse Communication: Mobility status PT Visit Diagnosis: Difficulty in walking, not elsewhere classified (R26.2)     Time: 6578-4696 PT Time Calculation (min) (ACUTE ONLY): 33 min  Charges:   $Gait Training: 8-22 mins $Therapeutic Exercise: 8-22 mins                    G Codes:       Pg [phone]    Natallie Ravenscroft 05/12/2017, 3:06 PM

## 2017-05-13 LAB — CBC
HEMATOCRIT: 38.1 % — AB (ref 39.0–52.0)
HEMOGLOBIN: 12.4 g/dL — AB (ref 13.0–17.0)
MCH: 29.2 pg (ref 26.0–34.0)
MCHC: 32.5 g/dL (ref 30.0–36.0)
MCV: 89.6 fL (ref 78.0–100.0)
Platelets: 238 10*3/uL (ref 150–400)
RBC: 4.25 MIL/uL (ref 4.22–5.81)
RDW: 13.2 % (ref 11.5–15.5)
WBC: 11.3 10*3/uL — AB (ref 4.0–10.5)

## 2017-05-13 NOTE — Care Management Note (Signed)
Case Management Note  Patient Details  Name: Nathan Larson MRN: [id number] Date of Birth: Jun 20, 1956  Subjective/Objective:     Left TKA               Action/Plan: Discharge Planning: PT recommended HHPT. Orders entered by attending. NCM spoke to pt and offered choice for [facility]. Pt agreeable to [facility] for HHPT. Has RW and shower chair at home. Wife at home to assist with care.   PCP Lavell Islam MD  Expected Discharge Date:  05/13/17               Expected Discharge Plan:  Home w Home Health Services  In-House Referral:  NA  Discharge planning Services  CM Consult  Post Acute Care Choice:  Home Health Choice offered to:  Patient  DME Arranged:  N/A (Pt have RW at home) DME Agency:  NA  HH Arranged:  PT HH Agency:  [facility] ([facility]  Status of Service:  Completed, signed off  If discussed at [employer], dates discussed:    Additional Comments:  Elliot Cousin, RN 05/13/2017, 10:09 AM

## 2017-05-13 NOTE — Progress Notes (Signed)
Pt to d/c home with [facility]. No DME needs. AVS reviewed and "My Chart" discussed with pt. Pt capable of verbalizing medications, signs and symptoms of infection, and follow-up appointments. Remains hemodynamically stable. No signs and symptoms of distress. Educated pt to return to ER in the case of SOB, dizziness, or chest pain.

## 2017-05-13 NOTE — Progress Notes (Signed)
Physical Therapy Treatment Patient Details Name: Nathan Larson MRN: [id number] DOB: 1955/10/29 Today's Date: 05/13/2017    History of Present Illness Pt s/p L TKR     PT Comments       Follow Up Recommendations  Home health PT     Equipment Recommendations  None recommended by PT    Recommendations for Other Services OT consult     Precautions / Restrictions Precautions Precautions: Knee;Fall Required Braces or Orthoses: Knee Immobilizer - Left Knee Immobilizer - Left: Discontinue once straight leg raise with < 10 degree lag Restrictions Weight Bearing Restrictions: No Other Position/Activity Restrictions: WBAT    Mobility  Bed Mobility                  Transfers                    Ambulation/Gait                 Stairs            Wheelchair Mobility    Modified Rankin (Stroke Patients Only)       Balance                                            Cognition Arousal/Alertness: Awake/alert Behavior During Therapy: WFL for tasks assessed/performed Overall Cognitive Status: Within Functional Limits for tasks assessed                                        Exercises Total Joint Exercises Ankle Circles/Pumps: AROM;Both;Supine;20 reps Quad Sets: AROM;Both;Supine;15 reps Heel Slides: AAROM;Left;15 reps;Supine Straight Leg Raises: AAROM;Left;Supine;20 reps Goniometric ROM: AAROM L knee -10 - 60     General Comments        Pertinent Vitals/Pain Pain Assessment: 0-10 Pain Score: 5  Pain Location: L knee Pain Descriptors / Indicators: Aching;Sore Pain Intervention(s): Limited activity within patient's tolerance;Monitored during session;Premedicated before session;Ice applied    Home Living                      Prior Function            PT Goals (current goals can now be found in the care plan section) Acute Rehab PT Goals Patient Stated Goal: Regain IND and get back to  playing golf PT Goal Formulation: With patient Time For Goal Achievement: 05/14/17 Potential to Achieve Goals: Good Progress towards PT goals: Progressing toward goals    Frequency    7X/week      PT Plan Current plan remains appropriate    Co-evaluation              AM-PAC PT "6 Clicks" Daily Activity  Outcome Measure  Difficulty turning over in bed (including adjusting bedclothes, sheets and blankets)?: A Lot Difficulty moving from lying on back to sitting on the side of the bed? : A Little Difficulty sitting down on and standing up from a chair with arms (e.g., wheelchair, bedside commode, etc,.)?: A Little Help needed moving to and from a bed to chair (including a wheelchair)?: A Little Help needed walking in hospital room?: A Little Help needed climbing 3-5 steps with a railing? : A Little 6 Click Score: 17    End of Session  Activity Tolerance: Patient tolerated treatment well;Patient limited by pain Patient left: with call bell/phone within reach;in bed Nurse Communication: Mobility status PT Visit Diagnosis: Difficulty in walking, not elsewhere classified (R26.2)     Time: 1610-9604 PT Time Calculation (min) (ACUTE ONLY): 17 min  Charges:  $Therapeutic Exercise: 8-22 mins                    G Codes:       Pg [phone]    Nathan Larson 05/13/2017, 12:15 PM

## 2017-05-13 NOTE — Progress Notes (Signed)
Physical Therapy Treatment Patient Details Name: Nathan Larson MRN: [id number] DOB: [birth year] Today's Date: 05/13/2017    History of Present Illness Pt s/p L TKR     PT Comments    Pt progressing well with mobility.  Reviewed stairs and written home therex program.  Pt plans dc home this date with HHPT follow up.   Follow Up Recommendations  Home health PT     Equipment Recommendations  None recommended by PT    Recommendations for Other Services OT consult     Precautions / Restrictions Precautions Precautions: Knee;Fall Required Braces or Orthoses: Knee Immobilizer - Left Knee Immobilizer - Left: Discontinue once straight leg raise with < 10 degree lag Restrictions Weight Bearing Restrictions: No Other Position/Activity Restrictions: WBAT    Mobility  Bed Mobility Overal bed mobility: Modified Independent Bed Mobility: Supine to Sit     Supine to sit: Modified independent (Device/Increase time)     General bed mobility comments: Pt unassisted to EOB  Transfers Overall transfer level: Needs assistance Equipment used: Rolling walker (2 wheeled) Transfers: Sit to/from Stand Sit to Stand: Supervision         General transfer comment: min cues for LE management and use of UEs to self assist  Ambulation/Gait Ambulation/Gait assistance: Min guard;Supervision Ambulation Distance (Feet): 180 Feet (100' with KI and 41' sans KI) Assistive device: Rolling walker (2 wheeled) Gait Pattern/deviations: Step-to pattern;Decreased step length - right;Decreased step length - left;Shuffle;Trunk flexed Gait velocity: decr Gait velocity interpretation: Below normal speed for age/gender General Gait Details: min cues for posture, position from RW and stride length   Stairs Stairs: Yes   Stair Management: No rails;Step to pattern;Backwards;Forwards;With walker Number of Stairs: 4 General stair comments: single step twice fwd and twice bkwd with cues for sequence and  foot/RW placement  Wheelchair Mobility    Modified Rankin (Stroke Patients Only)       Balance                                            Cognition Arousal/Alertness: Awake/alert Behavior During Therapy: WFL for tasks assessed/performed Overall Cognitive Status: Within Functional Limits for tasks assessed                                        Exercises Total Joint Exercises Ankle Circles/Pumps: AROM;Both;Supine;20 reps Quad Sets: AROM;Both;Supine;15 reps Heel Slides: AAROM;Left;15 reps;Supine Straight Leg Raises: AAROM;Left;Supine;20 reps Goniometric ROM: AAROM L knee -10 - 60     General Comments        Pertinent Vitals/Pain Pain Assessment: 0-10 Pain Score: 4  Pain Location: L knee Pain Descriptors / Indicators: Aching;Sore Pain Intervention(s): Limited activity within patient's tolerance;Monitored during session;Premedicated before session;Ice applied    Home Living                      Prior Function            PT Goals (current goals can now be found in the care plan section) Acute Rehab PT Goals Patient Stated Goal: Regain IND and get back to playing golf PT Goal Formulation: With patient Time For Goal Achievement: 05/14/17 Potential to Achieve Goals: Good Progress towards PT goals: Progressing toward goals    Frequency    7X/week  PT Plan Current plan remains appropriate    Co-evaluation              AM-PAC PT "6 Clicks" Daily Activity  Outcome Measure  Difficulty turning over in bed (including adjusting bedclothes, sheets and blankets)?: A Lot Difficulty moving from lying on back to sitting on the side of the bed? : A Little Difficulty sitting down on and standing up from a chair with arms (e.g., wheelchair, bedside commode, etc,.)?: A Little Help needed moving to and from a bed to chair (including a wheelchair)?: A Little Help needed walking in hospital room?: A Little Help needed  climbing 3-5 steps with a railing? : A Little 6 Click Score: 17    End of Session Equipment Utilized During Treatment: Gait belt;Right knee immobilizer Activity Tolerance: Patient tolerated treatment well Patient left: in chair;with call bell/phone within reach Nurse Communication: Mobility status PT Visit Diagnosis: Difficulty in walking, not elsewhere classified (R26.2)     Time: 4098-1191 PT Time Calculation (min) (ACUTE ONLY): 23 min  Charges:  $Gait Training: 23-37 mins $Therapeutic Exercise: 8-22 mins                    G Codes:       Pg [phone]    Nathan Larson 05/13/2017, 12:21 PM

## 2017-05-13 NOTE — Progress Notes (Signed)
   Subjective: 2 Days Post-Op Procedure(s) (LRB): LEFT TOTAL KNEE ARTHROPLASTY (Left)  Pt doing well Ready for d/c home today Denies any new symptoms or issues Patient reports pain as mild.  Objective:   VITALS:   Vitals:   05/12/17 2239 05/13/17 0638  BP: 127/61 120/75  Pulse: 66 91  Resp: 18 15  Temp: 99 F (37.2 C) 98.8 F (37.1 C)  SpO2: 97% 98%    Left knee incision healing well nv intact distally No rashes or edema  Good early rom  LABS  Recent Labs  05/12/17 0523 05/13/17 0507  HGB 13.2 12.4*  HCT 39.8 38.1*  WBC 12.5* 11.3*  PLT 236 238     Recent Labs  05/12/17 0523  NA 140  K 4.0  BUN 11  CREATININE 0.92  GLUCOSE 111*     Assessment/Plan: 2 Days Post-Op Procedure(s) (LRB): LEFT TOTAL KNEE ARTHROPLASTY (Left) D/c home today after therapy  F/u in 2 weeks in the office HHPT Pain management as needed    Alphonsa Overall, MPAS, PA-C  05/13/2017, 9:03 AM

## 2017-05-13 NOTE — Discharge Summary (Signed)
Physician Discharge Summary    Patient ID: Nathan Larson MRN: [id number] DOB/AGE: 1956-04-22 61 y.o.  Admit date: 05/11/2017 Discharge date:  05/13/17  Procedures:  Procedure(s) (LRB): LEFT TOTAL KNEE ARTHROPLASTY (Left)  Attending Physician:  Dr. Malon Kindle  Admission Diagnoses:   Left knee end stage osteoarthritis  Consultants:  PT/OT  Discharge Diagnoses:  Active Problems:   Left knee DJD  Past Medical History:  Diagnosis Date  . Arthritis   . Heart murmur    As a child     PCP: Cloward, Laban Emperor, MD   Discharged Condition: good  Hospital Course:  Patient underwent the above stated procedure on 05/11/2017. Patient tolerated the procedure well and brought to the recovery room in good condition and subsequently to the floor. No complications during their hospital stay. No complicating wound issues during the hospital stay. Incision healing well and good early range of motion  Disposition: 01-Home or Self Care with follow up in 2 weeks  Medications: Current Facility-Administered Medications  Medication Dose Route Frequency Provider Last Rate Last Dose  . acetaminophen (TYLENOL) tablet 650 mg  650 mg Oral Q6H PRN Jene Every, MD       Or  . acetaminophen (TYLENOL) suppository 650 mg  650 mg Rectal Q6H PRN Jene Every, MD      . acidophilus (RISAQUAD) capsule 1 capsule  1 capsule Oral Daily Jene Every, MD   1 capsule at 05/13/17 0746  . alum & mag hydroxide-simeth (MAALOX/MYLANTA) 200-200-20 MG/5ML suspension 30 mL  30 mL Oral Q4H PRN Jene Every, MD      . aspirin EC tablet 325 mg  325 mg Oral BID PC Jene Every, MD   325 mg at 05/13/17 0746  . bisacodyl (DULCOLAX) EC tablet 5 mg  5 mg Oral Daily PRN Jene Every, MD      . diphenhydrAMINE (BENADRYL) 12.5 MG/5ML elixir 12.5-25 mg  12.5-25 mg Oral Q4H PRN Jene Every, MD      . docusate sodium (COLACE) capsule 100 mg  100 mg Oral BID Jene Every, MD   100 mg at 05/12/17 2107  .  HYDROmorphone (DILAUDID) injection 1 mg  1 mg Intravenous Q2H PRN Jene Every, MD      . magnesium citrate solution 1 Bottle  1 Bottle Oral Once PRN Jene Every, MD      . menthol-cetylpyridinium (CEPACOL) lozenge 3 mg  1 lozenge Oral PRN Jene Every, MD       Or  . phenol (CHLORASEPTIC) mouth spray 1 spray  1 spray Mouth/Throat PRN Jene Every, MD      . methocarbamol (ROBAXIN) tablet 500 mg  500 mg Oral Q6H PRN Jene Every, MD   500 mg at 05/13/17 0315   Or  . methocarbamol (ROBAXIN) 500 mg in dextrose 5 % 50 mL IVPB  500 mg Intravenous Q6H PRN Jene Every, MD 110 mL/hr at 05/11/17 1048 500 mg at 05/11/17 1048  . metoCLOPramide (REGLAN) tablet 5-10 mg  5-10 mg Oral Q8H PRN Jene Every, MD       Or  . metoCLOPramide (REGLAN) injection 5-10 mg  5-10 mg Intravenous Q8H PRN Jene Every, MD      . ondansetron (ZOFRAN) tablet 4 mg  4 mg Oral Q6H PRN Jene Every, MD       Or  . ondansetron (ZOFRAN) injection 4 mg  4 mg Intravenous Q6H PRN Jene Every, MD      . oxyCODONE (Oxy IR/ROXICODONE) immediate release tablet 5-10 mg  5-10  mg Oral Q3H PRN Jene Every, MD   10 mg at 05/13/17 0745  . polyethylene glycol (MIRALAX / GLYCOLAX) packet 17 g  17 g Oral Daily PRN Jene Every, MD        Follow-up Information    Jene Every, MD Follow up in 2 week(s).   Specialty:  Orthopedic Surgery Contact information: [address] [place] [address] [phone]           Discharge Instructions    Call MD / Call 911    Complete by:  As directed    If you experience chest pain or shortness of breath, CALL 911 and be transported to the hospital emergency room.  If you develope a fever above 101 F, pus (white drainage) or increased drainage or redness at the wound, or calf pain, call your surgeon's office.   Constipation Prevention    Complete by:  As directed    Drink plenty of fluids.  Prune juice may be helpful.  You may use a stool softener,  such as Colace (over the counter) 100 mg twice a day.  Use MiraLax (over the counter) for constipation as needed.   Diet - low sodium heart healthy    Complete by:  As directed    Increase activity slowly as tolerated    Complete by:  As directed       Allergies as of 05/13/2017   No Known Allergies     Medication List    TAKE these medications   aspirin EC 325 MG tablet Take 1 tablet (325 mg total) by mouth 2 (two) times daily. What changed:  medication strength  how much to take  when to take this   docusate sodium 100 MG capsule Commonly known as:  COLACE Take 1 capsule (100 mg total) by mouth 2 (two) times daily as needed for mild constipation.   glucosamine-chondroitin 500-400 MG tablet Take 2 tablets by mouth daily.   methocarbamol 500 MG tablet Commonly known as:  ROBAXIN Take 1 tablet (500 mg total) by mouth every 6 (six) hours as needed for muscle spasms.   multivitamin with minerals Tabs tablet Take 1 tablet by mouth daily.   oxyCODONE-acetaminophen 5-325 MG tablet Commonly known as:  PERCOCET Take 1-2 tablets by mouth every 4 (four) hours as needed for severe pain.   polyethylene glycol packet Commonly known as:  MIRALAX / GLYCOLAX Take 17 g by mouth daily.       Signed: Thea Gist 05/13/2017, 9:06 AM

## 2017-05-17 ENCOUNTER — Ambulatory Visit: Payer: Self-pay | Admitting: Orthopedic Surgery

## 2017-06-30 ENCOUNTER — Other Ambulatory Visit (HOSPITAL_COMMUNITY): Payer: Self-pay | Admitting: Emergency Medicine

## 2017-06-30 NOTE — Progress Notes (Signed)
CT chest 07-13-17 epic care everywhere

## 2017-06-30 NOTE — Patient Instructions (Signed)
Nathan ParkerConrad Larson  06/30/2017   Your procedure is scheduled on: 07-06-17  [facility]  Entrance [place]  elevators to 3rd floor to  Short Stay Center at 530AM.   Call this number if you have problems the morning of surgery [phone]    Remember: ONLY 1 PERSON MAY GO WITH YOU TO SHORT STAY TO GET  READY MORNING OF YOUR SURGERY.  Do not eat food or drink liquids :After Midnight.     Take these medicines the morning of surgery with A SIP OF WATER: NONE                                You may not have any metal on your body including hair pins and              piercings  Do not wear jewelry, make-up, lotions, powders or perfumes, deodorant                        Men may shave face and neck.   Do not bring valuables to the hospital. [facility] IS NOT             RESPONSIBLE   FOR VALUABLES.  Contacts, dentures or bridgework may not be worn into surgery.  Leave suitcase in the car. After surgery it may be brought to your room.                 Please read over the following fact sheets you were given: _____________________________________________________________________            [facility] - Preparing for [facility] Before surgery, you can play an important role.  Because skin is not sterile, your skin needs to be as free of germs as possible.  You can reduce the number of germs on your skin by washing with CHG (chlorahexidine gluconate) soap before surgery.  CHG is an antiseptic cleaner which kills germs and bonds with the skin to continue killing germs even after washing. Please DO NOT use if you have an allergy to CHG or antibacterial soaps.  If your skin becomes reddened/irritated stop using the CHG and inform your nurse when you arrive at Short Stay. Do not shave (including legs and underarms) for at least 48 hours prior to the first CHG shower.  You may shave your face/neck. Please follow these instructions carefully:  1.  Shower with CHG  Soap the night before surgery and the  morning of Surgery.  2.  If you choose to wash your hair, wash your hair first as usual with your  normal  shampoo.  3.  After you shampoo, rinse your hair and body thoroughly to remove the  shampoo.                           4.  Use CHG as you would any other liquid soap.  You can apply chg directly  to the skin and wash                       Gently with a scrungie or clean washcloth.  5.  Apply the CHG Soap to your body ONLY FROM THE NECK DOWN.   Do not use on face/ open  Wound or open sores. Avoid contact with eyes, ears mouth and genitals (private parts).                       Wash face,  Genitals (private parts) with your normal soap.             6.  Wash thoroughly, paying special attention to the area where your surgery  will be performed.  7.  Thoroughly rinse your body with warm water from the neck down.  8.  DO NOT shower/wash with your normal soap after using and rinsing off  the CHG Soap.                9.  Pat yourself dry with a clean towel.            10.  Wear clean pajamas.            11.  Place clean sheets on your bed the night of your first shower and do not  sleep with pets. Day of Surgery : Do not apply any lotions/deodorants the morning of surgery.  Please wear clean clothes to the hospital/surgery center.  FAILURE TO FOLLOW THESE INSTRUCTIONS MAY RESULT IN THE CANCELLATION OF YOUR SURGERY PATIENT SIGNATURE_________________________________  NURSE SIGNATURE__________________________________  ________________________________________________________________________   Nathan Larson  An incentive spirometer is a tool that can help keep your lungs clear and active. This tool measures how well you are filling your lungs with each breath. Taking long deep breaths may help reverse or decrease the chance of developing breathing (pulmonary) problems (especially infection) following:  A long period of time  when you are unable to move or be active. BEFORE THE PROCEDURE   If the spirometer includes an indicator to show your best effort, your nurse or respiratory therapist will set it to a desired goal.  If possible, sit up straight or lean slightly forward. Try not to slouch.  Hold the incentive spirometer in an upright position. INSTRUCTIONS FOR USE  1. Sit on the edge of your bed if possible, or sit up as far as you can in bed or on a chair. 2. Hold the incentive spirometer in an upright position. 3. Breathe out normally. 4. Place the mouthpiece in your mouth and seal your lips tightly around it. 5. Breathe in slowly and as deeply as possible, raising the piston or the ball toward the top of the column. 6. Hold your breath for 3-5 seconds or for as long as possible. Allow the piston or ball to fall to the bottom of the column. 7. Remove the mouthpiece from your mouth and breathe out normally. 8. Rest for a few seconds and repeat Steps 1 through 7 at least 10 times every 1-2 hours when you are awake. Take your time and take a few normal breaths between deep breaths. 9. The spirometer may include an indicator to show your best effort. Use the indicator as a goal to work toward during each repetition. 10. After each set of 10 deep breaths, practice coughing to be sure your lungs are clear. If you have an incision (the cut made at the time of surgery), support your incision when coughing by placing a pillow or rolled up towels firmly against it. Once you are able to get out of bed, walk around indoors and cough well. You may stop using the incentive spirometer when instructed by your caregiver.  RISKS AND COMPLICATIONS  Take your time so you do not get  dizzy or light-headed.  If you are in pain, you may need to take or ask for pain medication before doing incentive spirometry. It is harder to take a deep breath if you are having pain. AFTER USE  Rest and breathe slowly and easily.  It can be  helpful to keep track of a log of your progress. Your caregiver can provide you with a simple table to help with this. If you are using the spirometer at home, follow these instructions: [place] IF:   You are having difficultly using the spirometer.  You have trouble using the spirometer as often as instructed.  Your pain medication is not giving enough relief while using the spirometer.  You develop fever of 100.5 F (38.1 C) or higher. SEEK IMMEDIATE MEDICAL CARE IF:   You cough up bloody sputum that had not been present before.  You develop fever of 102 F (38.9 C) or greater.  You develop worsening pain at or near the incision site. MAKE SURE YOU:   Understand these instructions.  Will watch your condition.  Will get help right away if you are not doing well or get worse. Document Released: 11/28/2006 Document Revised: 10/10/2011 Document Reviewed: 01/29/2007 [facility] Patient Information 2014 [place], [place].   ________________________________________________________________________

## 2017-07-04 ENCOUNTER — Encounter (HOSPITAL_COMMUNITY): Payer: Self-pay

## 2017-07-04 ENCOUNTER — Other Ambulatory Visit: Payer: Self-pay

## 2017-07-04 ENCOUNTER — Encounter (HOSPITAL_COMMUNITY)
Admission: RE | Admit: 2017-07-04 | Discharge: 2017-07-04 | Disposition: A | Discharge status: Home / Self Care | Payer: BLUE CROSS/BLUE SHIELD | Source: Ambulatory Visit | Attending: Specialist | Admitting: Specialist

## 2017-07-04 HISTORY — DX: Prediabetes: R73.03

## 2017-07-04 LAB — APTT: aPTT: 29 seconds (ref 24–36)

## 2017-07-04 LAB — URINALYSIS, ROUTINE W REFLEX MICROSCOPIC
Bilirubin Urine: NEGATIVE
GLUCOSE, UA: NEGATIVE mg/dL
Hgb urine dipstick: NEGATIVE
KETONES UR: NEGATIVE mg/dL
Leukocytes, UA: NEGATIVE
Nitrite: NEGATIVE
PROTEIN: NEGATIVE mg/dL
SPECIFIC GRAVITY, URINE: 1.015 (ref 1.005–1.030)
pH: 6 (ref 5.0–8.0)

## 2017-07-04 LAB — SURGICAL PCR SCREEN
MRSA, PCR: NEGATIVE
STAPHYLOCOCCUS AUREUS: NEGATIVE

## 2017-07-04 LAB — PROTIME-INR
INR: 1.07
Prothrombin Time: 13.9 seconds (ref 11.4–15.2)

## 2017-07-04 NOTE — Progress Notes (Signed)
CBC, HBGA1C, CMP, 06-21-17 Epic CARE EVERYWHERE

## 2017-07-05 NOTE — Anesthesia Preprocedure Evaluation (Addendum)
Anesthesia Evaluation  Patient identified by MRN, date of birth, ID band Patient awake    Reviewed: Allergy & Precautions, NPO status , Patient's Chart, lab work & pertinent test results  History of Anesthesia Complications Negative for: history of anesthetic complications  Airway Mallampati: II  TM Distance: >3 FB Neck ROM: Limited    Dental no notable dental hx. (+) Dental Advisory Given   Pulmonary former smoker,    Pulmonary exam normal        Cardiovascular negative cardio ROS Normal cardiovascular exam     Neuro/Psych negative neurological ROS  negative psych ROS   GI/Hepatic negative GI ROS, Neg liver ROS,   Endo/Other  negative endocrine ROS  Renal/GU negative Renal ROS     Musculoskeletal  (+) Arthritis ,   Abdominal   Peds  Hematology negative hematology ROS (+)   Anesthesia Other Findings Day of surgery medications reviewed with the patient.  Reproductive/Obstetrics                           Anesthesia Physical Anesthesia Plan  ASA: II  Anesthesia Plan: General   Post-op Pain Management: GA combined w/ Regional for post-op pain   Induction: Intravenous  PONV Risk Score and Plan: 1 and Ondansetron and Dexamethasone  Airway Management Planned: Oral ETT  Additional Equipment:   Intra-op Plan:   Post-operative Plan: Extubation in OR  Informed Consent: I have reviewed the patients History and Physical, chart, labs and discussed the procedure including the risks, benefits and alternatives for the proposed anesthesia with the patient or authorized representative who has indicated his/her understanding and acceptance.   Dental advisory given  Plan Discussed with: CRNA, Anesthesiologist and Surgeon  Anesthesia Plan Comments: (Pt requests GA)      Anesthesia Quick Evaluation

## 2017-07-06 ENCOUNTER — Inpatient Hospital Stay (HOSPITAL_COMMUNITY): Payer: BLUE CROSS/BLUE SHIELD | Admitting: Certified Registered Nurse Anesthetist

## 2017-07-06 ENCOUNTER — Inpatient Hospital Stay (HOSPITAL_COMMUNITY)
Admission: RE | Admit: 2017-07-06 | Discharge: 2017-07-08 | DRG: 470 | Disposition: A | Discharge status: Home Health | Payer: BLUE CROSS/BLUE SHIELD | Source: Ambulatory Visit | Attending: Specialist | Admitting: Specialist

## 2017-07-06 ENCOUNTER — Inpatient Hospital Stay (HOSPITAL_COMMUNITY): Payer: BLUE CROSS/BLUE SHIELD

## 2017-07-06 ENCOUNTER — Encounter (HOSPITAL_COMMUNITY): Payer: Self-pay | Admitting: Emergency Medicine

## 2017-07-06 ENCOUNTER — Other Ambulatory Visit: Payer: Self-pay

## 2017-07-06 ENCOUNTER — Encounter (HOSPITAL_COMMUNITY)
Admission: RE | Disposition: A | Discharge status: Home Health | Payer: Self-pay | Source: Ambulatory Visit | Attending: Specialist

## 2017-07-06 DIAGNOSIS — Z87891 Personal history of nicotine dependence: Secondary | ICD-10-CM | POA: Diagnosis not present

## 2017-07-06 DIAGNOSIS — Z96652 Presence of left artificial knee joint: Secondary | ICD-10-CM | POA: Diagnosis present

## 2017-07-06 DIAGNOSIS — M1711 Unilateral primary osteoarthritis, right knee: Secondary | ICD-10-CM | POA: Diagnosis present

## 2017-07-06 DIAGNOSIS — Z7982 Long term (current) use of aspirin: Secondary | ICD-10-CM | POA: Diagnosis not present

## 2017-07-06 DIAGNOSIS — Z96659 Presence of unspecified artificial knee joint: Secondary | ICD-10-CM

## 2017-07-06 HISTORY — PX: TOTAL KNEE ARTHROPLASTY: SHX125

## 2017-07-06 LAB — CBC WITH DIFFERENTIAL/PLATELET
BASOS PCT: 0 %
Basophils Absolute: 0 10*3/uL (ref 0.0–0.1)
EOS ABS: 0 10*3/uL (ref 0.0–0.7)
Eosinophils Relative: 0 %
HCT: 37.8 % — ABNORMAL LOW (ref 39.0–52.0)
Hemoglobin: 12.4 g/dL — ABNORMAL LOW (ref 13.0–17.0)
Lymphocytes Relative: 12 %
Lymphs Abs: 1.4 10*3/uL (ref 0.7–4.0)
MCH: 29.1 pg (ref 26.0–34.0)
MCHC: 32.8 g/dL (ref 30.0–36.0)
MCV: 88.7 fL (ref 78.0–100.0)
MONO ABS: 0.2 10*3/uL (ref 0.1–1.0)
MONOS PCT: 2 %
Neutro Abs: 9.9 10*3/uL — ABNORMAL HIGH (ref 1.7–7.7)
Neutrophils Relative %: 86 %
PLATELETS: 252 10*3/uL (ref 150–400)
RBC: 4.26 MIL/uL (ref 4.22–5.81)
RDW: 13.4 % (ref 11.5–15.5)
WBC: 11.6 10*3/uL — ABNORMAL HIGH (ref 4.0–10.5)

## 2017-07-06 SURGERY — ARTHROPLASTY, KNEE, TOTAL
Anesthesia: General | Site: Knee | Laterality: Right

## 2017-07-06 MED ORDER — DIPHENHYDRAMINE HCL 12.5 MG/5ML PO ELIX: 12.5000 mg | ORAL_SOLUTION | ORAL | Status: DC | PRN

## 2017-07-06 MED ORDER — ONDANSETRON HCL 4 MG/2ML IJ SOLN
INTRAMUSCULAR | Status: DC | PRN
  Administered 2017-07-06: 4 mg via INTRAVENOUS

## 2017-07-06 MED ORDER — EPHEDRINE SULFATE 50 MG/ML IJ SOLN
INTRAMUSCULAR | Status: DC | PRN
  Administered 2017-07-06: 10 mg via INTRAVENOUS

## 2017-07-06 MED ORDER — HYDROMORPHONE HCL 1 MG/ML IJ SOLN
INTRAMUSCULAR | Status: DC | PRN
  Administered 2017-07-06 (×2): 0.5 mg via INTRAVENOUS

## 2017-07-06 MED ORDER — FENTANYL CITRATE (PF) 100 MCG/2ML IJ SOLN
INTRAMUSCULAR | Status: AC
  Filled 2017-07-06: qty 4

## 2017-07-06 MED ORDER — ONDANSETRON HCL 4 MG PO TABS
4.0000 mg | ORAL_TABLET | Freq: Four times a day (QID) | ORAL | Status: DC | PRN
Start: 2017-07-06 — End: 2017-07-08

## 2017-07-06 MED ORDER — DOCUSATE SODIUM 100 MG PO CAPS
100.0000 mg | ORAL_CAPSULE | Freq: Two times a day (BID) | ORAL | Status: DC
  Administered 2017-07-06 – 2017-07-08 (×5): 100 mg via ORAL
  Filled 2017-07-06 (×5): qty 1

## 2017-07-06 MED ORDER — BISACODYL 5 MG PO TBEC: 5.0000 mg | DELAYED_RELEASE_TABLET | Freq: Every day | ORAL | Status: DC | PRN

## 2017-07-06 MED ORDER — ADULT MULTIVITAMIN W/MINERALS CH
1.0000 | ORAL_TABLET | Freq: Every day | ORAL | Status: DC
  Administered 2017-07-06 – 2017-07-08 (×3): 1 via ORAL
  Filled 2017-07-06 (×3): qty 1

## 2017-07-06 MED ORDER — POLYETHYLENE GLYCOL 3350 17 G PO PACK: 17.0000 g | PACK | Freq: Every day | ORAL | Status: DC | PRN

## 2017-07-06 MED ORDER — HYDROMORPHONE HCL 1 MG/ML IJ SOLN: 1.0000 mg | INTRAMUSCULAR | Status: DC | PRN

## 2017-07-06 MED ORDER — MAGNESIUM CITRATE PO SOLN: 1.0000 | Freq: Once | ORAL | Status: DC | PRN

## 2017-07-06 MED ORDER — OXYCODONE-ACETAMINOPHEN 5-325 MG PO TABS: 1.0000 | ORAL_TABLET | ORAL | Status: DC | PRN

## 2017-07-06 MED ORDER — SODIUM CHLORIDE 0.9 % IV SOLN
INTRAVENOUS | Status: AC
  Filled 2017-07-06: qty 500000

## 2017-07-06 MED ORDER — KCL IN DEXTROSE-NACL 20-5-0.45 MEQ/L-%-% IV SOLN
INTRAVENOUS | Status: AC
  Administered 2017-07-06: 12:00:00 via INTRAVENOUS
  Filled 2017-07-06 (×2): qty 1000

## 2017-07-06 MED ORDER — ACETAMINOPHEN 325 MG PO TABS
650.0000 mg | ORAL_TABLET | ORAL | Status: DC | PRN
  Administered 2017-07-06 – 2017-07-08 (×3): 650 mg via ORAL
  Filled 2017-07-06 (×3): qty 2

## 2017-07-06 MED ORDER — METOCLOPRAMIDE HCL 5 MG/ML IJ SOLN: 5.0000 mg | Freq: Three times a day (TID) | INTRAMUSCULAR | Status: DC | PRN

## 2017-07-06 MED ORDER — ROCURONIUM BROMIDE 50 MG/5ML IV SOSY
PREFILLED_SYRINGE | INTRAVENOUS | Status: DC | PRN
  Administered 2017-07-06: 40 mg via INTRAVENOUS

## 2017-07-06 MED ORDER — ACETAMINOPHEN 10 MG/ML IV SOLN
INTRAVENOUS | Status: AC
  Filled 2017-07-06: qty 100

## 2017-07-06 MED ORDER — PHENOL 1.4 % MT LIQD: 1.0000 | OROMUCOSAL | Status: DC | PRN

## 2017-07-06 MED ORDER — ROPIVACAINE HCL 5 MG/ML IJ SOLN
INTRAMUSCULAR | Status: DC | PRN
  Administered 2017-07-06: 30 mg via PERINEURAL

## 2017-07-06 MED ORDER — CEFAZOLIN SODIUM-DEXTROSE 2-4 GM/100ML-% IV SOLN
2.0000 g | INTRAVENOUS | Status: AC
  Administered 2017-07-06: 2 g via INTRAVENOUS

## 2017-07-06 MED ORDER — FENTANYL CITRATE (PF) 250 MCG/5ML IJ SOLN
INTRAMUSCULAR | Status: AC
Start: 2017-07-06 — End: 2017-07-06
  Filled 2017-07-06: qty 5

## 2017-07-06 MED ORDER — FENTANYL CITRATE (PF) 100 MCG/2ML IJ SOLN
INTRAMUSCULAR | Status: DC | PRN
  Administered 2017-07-06 (×2): 100 ug via INTRAVENOUS
  Administered 2017-07-06 (×2): 50 ug via INTRAVENOUS

## 2017-07-06 MED ORDER — PROPOFOL 10 MG/ML IV BOLUS
INTRAVENOUS | Status: DC | PRN
  Administered 2017-07-06: 160 mg via INTRAVENOUS

## 2017-07-06 MED ORDER — PROMETHAZINE HCL 25 MG/ML IJ SOLN: 6.2500 mg | INTRAMUSCULAR | Status: DC | PRN

## 2017-07-06 MED ORDER — FENTANYL CITRATE (PF) 100 MCG/2ML IJ SOLN
INTRAMUSCULAR | Status: AC
  Filled 2017-07-06: qty 2

## 2017-07-06 MED ORDER — BUPIVACAINE-EPINEPHRINE 0.25% -1:200000 IJ SOLN
INTRAMUSCULAR | Status: DC | PRN
  Administered 2017-07-06: 30 mL

## 2017-07-06 MED ORDER — SODIUM CHLORIDE 0.9 % IV SOLN
INTRAVENOUS | Status: DC | PRN
  Administered 2017-07-06: 500 mL

## 2017-07-06 MED ORDER — OXYCODONE HCL 5 MG PO TABS
10.0000 mg | ORAL_TABLET | ORAL | Status: DC | PRN
  Administered 2017-07-06 – 2017-07-08 (×9): 10 mg via ORAL
  Filled 2017-07-06 (×9): qty 2

## 2017-07-06 MED ORDER — DEXAMETHASONE SODIUM PHOSPHATE 10 MG/ML IJ SOLN
INTRAMUSCULAR | Status: AC
  Filled 2017-07-06: qty 1

## 2017-07-06 MED ORDER — POLYETHYLENE GLYCOL 3350 17 G PO PACK
17.0000 g | PACK | Freq: Every day | ORAL | Status: DC
  Filled 2017-07-06: qty 1

## 2017-07-06 MED ORDER — SODIUM CHLORIDE 0.9 % IR SOLN
Status: DC | PRN
  Administered 2017-07-06: 1000 mL

## 2017-07-06 MED ORDER — ONDANSETRON HCL 4 MG/2ML IJ SOLN
INTRAMUSCULAR | Status: AC
  Filled 2017-07-06: qty 2

## 2017-07-06 MED ORDER — HYDROMORPHONE HCL 2 MG/ML IJ SOLN
INTRAMUSCULAR | Status: AC
  Filled 2017-07-06: qty 1

## 2017-07-06 MED ORDER — DOCUSATE SODIUM 100 MG PO CAPS: 100.0000 mg | ORAL_CAPSULE | Freq: Two times a day (BID) | ORAL | Status: DC | PRN

## 2017-07-06 MED ORDER — LACTATED RINGERS IV SOLN
INTRAVENOUS | Status: DC
  Administered 2017-07-06 (×2): via INTRAVENOUS

## 2017-07-06 MED ORDER — METOCLOPRAMIDE HCL 5 MG PO TABS: 5.0000 mg | ORAL_TABLET | Freq: Three times a day (TID) | ORAL | Status: DC | PRN

## 2017-07-06 MED ORDER — RISAQUAD PO CAPS
1.0000 | ORAL_CAPSULE | Freq: Every day | ORAL | Status: DC
  Administered 2017-07-06 – 2017-07-08 (×3): 1 via ORAL
  Filled 2017-07-06 (×3): qty 1

## 2017-07-06 MED ORDER — SUCCINYLCHOLINE CHLORIDE 200 MG/10ML IV SOSY
PREFILLED_SYRINGE | INTRAVENOUS | Status: DC | PRN
  Administered 2017-07-06: 140 mg via INTRAVENOUS

## 2017-07-06 MED ORDER — BUPIVACAINE-EPINEPHRINE (PF) 0.25% -1:200000 IJ SOLN
INTRAMUSCULAR | Status: AC
  Filled 2017-07-06: qty 60

## 2017-07-06 MED ORDER — MIDAZOLAM HCL 5 MG/5ML IJ SOLN
INTRAMUSCULAR | Status: DC | PRN
  Administered 2017-07-06: 2 mg via INTRAVENOUS

## 2017-07-06 MED ORDER — METHOCARBAMOL 500 MG PO TABS
500.0000 mg | ORAL_TABLET | Freq: Four times a day (QID) | ORAL | Status: DC | PRN
  Administered 2017-07-06 – 2017-07-08 (×4): 500 mg via ORAL
  Filled 2017-07-06 (×5): qty 1

## 2017-07-06 MED ORDER — OXYCODONE HCL 5 MG PO TABS
5.0000 mg | ORAL_TABLET | ORAL | Status: DC | PRN
  Administered 2017-07-06 – 2017-07-07 (×5): 5 mg via ORAL
  Filled 2017-07-06 (×5): qty 1

## 2017-07-06 MED ORDER — MIDAZOLAM HCL 2 MG/2ML IJ SOLN
INTRAMUSCULAR | Status: AC
  Filled 2017-07-06: qty 2

## 2017-07-06 MED ORDER — CEFAZOLIN SODIUM-DEXTROSE 2-4 GM/100ML-% IV SOLN
2.0000 g | Freq: Four times a day (QID) | INTRAVENOUS | Status: AC
  Administered 2017-07-06 (×2): 2 g via INTRAVENOUS
  Filled 2017-07-06 (×2): qty 100

## 2017-07-06 MED ORDER — METHOCARBAMOL 1000 MG/10ML IJ SOLN
500.0000 mg | Freq: Four times a day (QID) | INTRAVENOUS | Status: DC | PRN
  Administered 2017-07-06: 500 mg via INTRAVENOUS
  Filled 2017-07-06: qty 550

## 2017-07-06 MED ORDER — ASPIRIN EC 325 MG PO TBEC
325.0000 mg | DELAYED_RELEASE_TABLET | Freq: Two times a day (BID) | ORAL | Status: DC
  Administered 2017-07-06 – 2017-07-08 (×4): 325 mg via ORAL
  Filled 2017-07-06 (×4): qty 1

## 2017-07-06 MED ORDER — FENTANYL CITRATE (PF) 100 MCG/2ML IJ SOLN: 25.0000 ug | INTRAMUSCULAR | Status: DC | PRN

## 2017-07-06 MED ORDER — ALUM & MAG HYDROXIDE-SIMETH 200-200-20 MG/5ML PO SUSP: 30.0000 mL | ORAL | Status: DC | PRN

## 2017-07-06 MED ORDER — ONDANSETRON HCL 4 MG/2ML IJ SOLN: 4.0000 mg | Freq: Four times a day (QID) | INTRAMUSCULAR | Status: DC | PRN

## 2017-07-06 MED ORDER — STERILE WATER FOR IRRIGATION IR SOLN
Status: DC | PRN
Start: 2017-07-06 — End: 2017-07-06
  Administered 2017-07-06: 2000 mL

## 2017-07-06 MED ORDER — TRANEXAMIC ACID 1000 MG/10ML IV SOLN
1000.0000 mg | INTRAVENOUS | Status: AC
  Administered 2017-07-06: 1000 mg via INTRAVENOUS
  Filled 2017-07-06: qty 1100

## 2017-07-06 MED ORDER — CEFAZOLIN SODIUM-DEXTROSE 2-4 GM/100ML-% IV SOLN
INTRAVENOUS | Status: AC
  Filled 2017-07-06: qty 100

## 2017-07-06 MED ORDER — SUGAMMADEX SODIUM 200 MG/2ML IV SOLN
INTRAVENOUS | Status: DC | PRN
  Administered 2017-07-06: 200 mg via INTRAVENOUS

## 2017-07-06 MED ORDER — DEXAMETHASONE SODIUM PHOSPHATE 10 MG/ML IJ SOLN
INTRAMUSCULAR | Status: DC | PRN
  Administered 2017-07-06: 10 mg via INTRAVENOUS

## 2017-07-06 MED ORDER — METHOCARBAMOL 500 MG PO TABS: 500.0000 mg | ORAL_TABLET | Freq: Four times a day (QID) | ORAL | Status: DC | PRN

## 2017-07-06 MED ORDER — VITAMIN C 500 MG PO TABS
1000.0000 mg | ORAL_TABLET | Freq: Every day | ORAL | Status: DC
  Administered 2017-07-07 – 2017-07-08 (×2): 1000 mg via ORAL
  Filled 2017-07-06 (×2): qty 2

## 2017-07-06 MED ORDER — LIDOCAINE 2% (20 MG/ML) 5 ML SYRINGE
INTRAMUSCULAR | Status: DC | PRN
  Administered 2017-07-06: 20 mg via INTRAVENOUS

## 2017-07-06 MED ORDER — MENTHOL 3 MG MT LOZG: 1.0000 | LOZENGE | OROMUCOSAL | Status: DC | PRN

## 2017-07-06 MED ORDER — PROPOFOL 10 MG/ML IV BOLUS
INTRAVENOUS | Status: AC
  Filled 2017-07-06: qty 40

## 2017-07-06 MED ORDER — ACETAMINOPHEN 10 MG/ML IV SOLN
1000.0000 mg | INTRAVENOUS | Status: AC
  Administered 2017-07-06: 1000 mg via INTRAVENOUS

## 2017-07-06 MED ORDER — ACETAMINOPHEN 650 MG RE SUPP: 650.0000 mg | RECTAL | Status: DC | PRN

## 2017-07-06 SURGICAL SUPPLY — 64 items
BAG ZIPLOCK 12X15 (MISCELLANEOUS) IMPLANT
BANDAGE ACE 4X5 VEL STRL LF (GAUZE/BANDAGES/DRESSINGS) ×3 IMPLANT
BANDAGE ACE 6X5 VEL STRL LF (GAUZE/BANDAGES/DRESSINGS) ×3 IMPLANT
BLADE SAG 18X100X1.27 (BLADE) ×3 IMPLANT
BLADE SAW SGTL 11.0X1.19X90.0M (BLADE) IMPLANT
BLADE SAW SGTL 13.0X1.19X90.0M (BLADE) ×6 IMPLANT
CAPT KNEE TOTAL 3 ATTUNE ×3 IMPLANT
CEMENT HV SMART SET (Cement) ×6 IMPLANT
CLOSURE WOUND 1/2 X4 (GAUZE/BANDAGES/DRESSINGS)
CLOTH 2% CHLOROHEXIDINE 3PK (PERSONAL CARE ITEMS) ×3 IMPLANT
COVER SURGICAL LIGHT HANDLE (MISCELLANEOUS) ×3 IMPLANT
CUFF TOURN SGL QUICK 34 (TOURNIQUET CUFF) ×2
CUFF TRNQT CYL 34X4X40X1 (TOURNIQUET CUFF) ×1 IMPLANT
DECANTER SPIKE VIAL GLASS SM (MISCELLANEOUS) IMPLANT
DRAPE INCISE IOBAN 66X45 STRL (DRAPES) IMPLANT
DRAPE ORTHO SPLIT 77X108 STRL (DRAPES) ×4
DRAPE SHEET LG 3/4 BI-LAMINATE (DRAPES) ×6 IMPLANT
DRAPE SURG ORHT 6 SPLT 77X108 (DRAPES) ×2 IMPLANT
DRAPE U-SHAPE 47X51 STRL (DRAPES) ×3 IMPLANT
DRSG AQUACEL AG ADV 3.5X10 (GAUZE/BANDAGES/DRESSINGS) IMPLANT
DRSG TEGADERM 4X4.75 (GAUZE/BANDAGES/DRESSINGS) IMPLANT
DURAPREP 26ML APPLICATOR (WOUND CARE) ×3 IMPLANT
ELECT REM PT RETURN 15FT ADLT (MISCELLANEOUS) ×3 IMPLANT
EVACUATOR 1/8 PVC DRAIN (DRAIN) IMPLANT
GAUZE SPONGE 2X2 8PLY STRL LF (GAUZE/BANDAGES/DRESSINGS) IMPLANT
GLOVE BIOGEL PI IND STRL 7.0 (GLOVE) ×1 IMPLANT
GLOVE BIOGEL PI IND STRL 7.5 (GLOVE) ×6 IMPLANT
GLOVE BIOGEL PI IND STRL 8 (GLOVE) ×1 IMPLANT
GLOVE BIOGEL PI INDICATOR 7.0 (GLOVE) ×2
GLOVE BIOGEL PI INDICATOR 7.5 (GLOVE) ×12
GLOVE BIOGEL PI INDICATOR 8 (GLOVE) ×2
GLOVE SURG SS PI 7.0 STRL IVOR (GLOVE) ×3 IMPLANT
GLOVE SURG SS PI 7.5 STRL IVOR (GLOVE) ×3 IMPLANT
GLOVE SURG SS PI 8.0 STRL IVOR (GLOVE) ×6 IMPLANT
GOWN STRL REUS W/ TWL LRG LVL3 (GOWN DISPOSABLE) ×2 IMPLANT
GOWN STRL REUS W/TWL LRG LVL3 (GOWN DISPOSABLE) ×4
GOWN STRL REUS W/TWL XL LVL3 (GOWN DISPOSABLE) ×9 IMPLANT
HANDPIECE INTERPULSE COAX TIP (DISPOSABLE) ×2
HEMOSTAT SPONGE AVITENE ULTRA (HEMOSTASIS) ×3 IMPLANT
IMMOBILIZER KNEE 20 (SOFTGOODS) ×3
IMMOBILIZER KNEE 20 THIGH 36 (SOFTGOODS) ×1 IMPLANT
MANIFOLD NEPTUNE II (INSTRUMENTS) ×3 IMPLANT
NS IRRIG 1000ML POUR BTL (IV SOLUTION) IMPLANT
PACK TOTAL KNEE CUSTOM (KITS) ×3 IMPLANT
POSITIONER SURGICAL ARM (MISCELLANEOUS) ×3 IMPLANT
SET HNDPC FAN SPRY TIP SCT (DISPOSABLE) ×1 IMPLANT
SPONGE GAUZE 2X2 STER 10/PKG (GAUZE/BANDAGES/DRESSINGS)
SPONGE SURGIFOAM ABS GEL 100 (HEMOSTASIS) IMPLANT
STAPLER VISISTAT (STAPLE) ×3 IMPLANT
STRIP CLOSURE SKIN 1/2X4 (GAUZE/BANDAGES/DRESSINGS) IMPLANT
SUT BONE WAX W31G (SUTURE) IMPLANT
SUT MNCRL AB 4-0 PS2 18 (SUTURE) IMPLANT
SUT STRATAFIX 0 PDS 27 VIOLET (SUTURE) ×3
SUT VIC AB 1 CT1 27 (SUTURE) ×4
SUT VIC AB 1 CT1 27XBRD ANTBC (SUTURE) ×2 IMPLANT
SUT VIC AB 2-0 CT1 27 (SUTURE) ×6
SUT VIC AB 2-0 CT1 TAPERPNT 27 (SUTURE) ×3 IMPLANT
SUTURE STRATFX 0 PDS 27 VIOLET (SUTURE) ×1 IMPLANT
SYR 50ML LL SCALE MARK (SYRINGE) IMPLANT
TOWER CARTRIDGE SMART MIX (DISPOSABLE) ×3 IMPLANT
TRAY FOLEY W/METER SILVER 16FR (SET/KITS/TRAYS/PACK) ×3 IMPLANT
WATER STERILE IRR 1000ML POUR (IV SOLUTION) ×6 IMPLANT
WRAP KNEE MAXI GEL POST OP (GAUZE/BANDAGES/DRESSINGS) ×3 IMPLANT
YANKAUER SUCT BULB TIP 10FT TU (MISCELLANEOUS) ×3 IMPLANT

## 2017-07-06 NOTE — Anesthesia Procedure Notes (Addendum)
Procedure Name: Intubation Date/Time: 07/06/2017 7:48 AM Performed by: Wynonia SoursWalker, Doraine Schexnider L, CRNA Pre-anesthesia Checklist: Patient identified, Emergency Drugs available, Suction available, Patient being monitored and Timeout performed Patient Re-evaluated:Patient Re-evaluated prior to induction Oxygen Delivery Method: Circle system utilized Preoxygenation: Pre-oxygenation with 100% oxygen Induction Type: IV induction, Rapid sequence and Cricoid Pressure applied Laryngoscope Size: Glidescope and 4 Grade View: Grade II Tube type: Oral Tube size: 7.5 mm Number of attempts: 1 Airway Equipment and Method: Stylet and Video-laryngoscopy Placement Confirmation: ETT inserted through vocal cords under direct vision,  positive ETCO2,  CO2 detector and breath sounds checked- equal and bilateral Secured at: 22 cm Tube secured with: Tape Dental Injury: Teeth and Oropharynx as per pre-operative assessment  Difficulty Due To: Difficulty was anticipated and Difficult Airway- due to anterior larynx Comments: Elective glidescope utilized due to prior intubation being difficult due to anterior larynx.

## 2017-07-06 NOTE — Transfer of Care (Signed)
Immediate Anesthesia Transfer of Care Note  Patient: Nathan ParkerConrad Larson  Procedure(s) Performed: RIGHT TOTAL KNEE ARTHROPLASTY (Right Knee)  Patient Location: PACU  Anesthesia Type:General and GA combined with regional for post-op pain  Level of Consciousness: awake, alert  and patient cooperative  Airway & Oxygen Therapy: Patient Spontanous Breathing and Patient connected to face mask oxygen  Post-op Assessment: Report given to RN, Post -op Vital signs reviewed and stable and Patient moving all extremities X 4  Post vital signs: Reviewed and stable  Last Vitals:  Vitals:   07/06/17 0710 07/06/17 0715  BP: 122/68 120/78  Pulse: (!) 54 (!) 56  Resp: 11 15  Temp:    SpO2: (!) 88% 100%    Last Pain:  Vitals:   07/06/17 0545  TempSrc: Oral      Patients Stated Pain Goal: 4 (07/06/17 [id number])  Complications: No apparent anesthesia complications

## 2017-07-06 NOTE — H&P (Signed)
Nathan ParkerConrad Larson is an 61 y.o. male.   Chief Complaint: Right knee pain HPI: End stage DJD right knee.  Past Medical History:  Diagnosis Date  . Arthritis   . Heart murmur    As a child; NON SYMPTOMATIC   . Pre-diabetes    DENIES; LAST HGB A1C 5.8 EPIC     Past Surgical History:  Procedure Laterality Date  . BILATERAL KNEE ARTHROSCOPY    . TOTAL KNEE ARTHROPLASTY Left 05/11/2017   Procedure: LEFT TOTAL KNEE ARTHROPLASTY;  Surgeon: Jene EveryBeane, Kaceton Vieau, MD;  Location: [facility];  Service: Orthopedics;  Laterality: Left;  120 mins with abductor block    Family History  Problem Relation Age of Onset  . Diabetes Mother    Social History:  reports that he has quit smoking. His smoking use included cigarettes. He quit after 1.00 year of use. he has never used smokeless tobacco. He reports that he drinks about 0.6 oz of alcohol per week. He reports that he does not use drugs.  Allergies: No Known Allergies  Medications Prior to Admission  Medication Sig Dispense Refill  . Ascorbic Acid (VITAMIN C) 1000 MG tablet Take 1,000 mg by mouth daily.    Marland Kitchen. aspirin EC 325 MG tablet Take 1 tablet (325 mg total) by mouth 2 (two) times daily. 60 tablet 1  . Ferrous Sulfate 28 MG TABS Take 1 tablet by mouth daily.    Marland Kitchen. glucosamine-chondroitin 500-400 MG tablet Take 2 tablets by mouth daily.    . Multiple Vitamin (MULTIVITAMIN WITH MINERALS) TABS tablet Take 1 tablet by mouth daily.    Marland Kitchen. docusate sodium (COLACE) 100 MG capsule Take 1 capsule (100 mg total) by mouth 2 (two) times daily as needed for mild constipation. (Patient not taking: Reported on 06/27/2017) 30 capsule 1  . methocarbamol (ROBAXIN) 500 MG tablet Take 1 tablet (500 mg total) by mouth every 6 (six) hours as needed for muscle spasms. (Patient not taking: Reported on 06/27/2017) 40 tablet 1  . oxyCODONE-acetaminophen (PERCOCET) 5-325 MG tablet Take 1-2 tablets by mouth every 4 (four) hours as needed for severe pain. (Patient not taking: Reported  on 06/27/2017) 50 tablet 0  . polyethylene glycol (MIRALAX / GLYCOLAX) packet Take 17 g by mouth daily. (Patient not taking: Reported on 06/27/2017) 14 each 0    Results for orders placed or performed during the hospital encounter of 07/04/17 (from the past 48 hour(s))  Surgical pcr screen     Status: None   Collection Time: 07/04/17  1:28 PM  Result Value Ref Range   MRSA, PCR NEGATIVE NEGATIVE   Staphylococcus aureus NEGATIVE NEGATIVE    Comment: (NOTE) The Xpert SA Assay (FDA approved for NASAL specimens in patients 61 years of age and older), is one component of a comprehensive surveillance program. It is not intended to diagnose infection nor to guide or monitor treatment.   Urinalysis, Routine w reflex microscopic     Status: None   Collection Time: 07/04/17  1:28 PM  Result Value Ref Range   Color, Urine YELLOW YELLOW   APPearance CLEAR CLEAR   Specific Gravity, Urine 1.015 1.005 - 1.030   pH 6.0 5.0 - 8.0   Glucose, UA NEGATIVE NEGATIVE mg/dL   Hgb urine dipstick NEGATIVE NEGATIVE   Bilirubin Urine NEGATIVE NEGATIVE   Ketones, ur NEGATIVE NEGATIVE mg/dL   Protein, ur NEGATIVE NEGATIVE mg/dL   Nitrite NEGATIVE NEGATIVE   Leukocytes, UA NEGATIVE NEGATIVE  APTT     Status: None  Collection Time: 07/04/17  2:00 PM  Result Value Ref Range   aPTT 29 24 - 36 seconds  Protime-INR     Status: None   Collection Time: 07/04/17  2:00 PM  Result Value Ref Range   Prothrombin Time 13.9 11.4 - 15.2 seconds   INR 1.07    No results found.  Review of Systems  Musculoskeletal: Positive for joint pain.  All other systems reviewed and are negative.   Blood pressure 120/78, pulse (!) 56, temperature 98 F (36.7 C), temperature source Oral, resp. rate 15, height 6' (1.829 m), weight 101.2 kg (223 lb), SpO2 100 %. Physical Exam  Constitutional: He is oriented to person, place, and time. He appears well-developed.  HENT:  Head: Normocephalic.  Eyes: Pupils are equal, round, and  reactive to light.  Neck: Normal range of motion.  Cardiovascular: Normal rate.  Respiratory: Effort normal.  GI: Soft.  Musculoskeletal:  Tender medial joint line. Varus deformity.ROM 0-120. No DVT. NVI  Neurological: He is alert and oriented to person, place, and time.  Skin: Skin is warm and dry.  Psychiatric: He has a normal mood and affect.    XRAY: End stage djd right knee with varus deformity  Assessment/Plan End stage DJD right knee Plan Right TKR. Risks and benefits discussed.  Javier DockerBEANE,Kristee Angus C, MD 07/06/2017, 7:33 AM

## 2017-07-06 NOTE — Discharge Instructions (Signed)

## 2017-07-06 NOTE — Brief Op Note (Signed)
07/06/2017  9:42 AM  PATIENT:  Corrin Parkeronrad Regala  61 y.o. male  PRE-OPERATIVE DIAGNOSIS:  Right knee degenerative joint disease  POST-OPERATIVE DIAGNOSIS:  Right knee degenerative joint disease  PROCEDURE:  Procedure(s) with comments: RIGHT TOTAL KNEE ARTHROPLASTY (Right) - 120 mins  SURGEON:  Surgeon(s) and Role:    Jene Every* Jenese Mischke, MD - Primary  PHYSICIAN ASSISTANT:   ASSISTANTS: Bissell   ANESTHESIA:   general  EBL:  50 mL   BLOOD ADMINISTERED:none  DRAINS: none   LOCAL MEDICATIONS USED:  MARCAINE     SPECIMEN:  No Specimen  DISPOSITION OF SPECIMEN:  N/A  COUNTS:  YES  TOURNIQUET:   Total Tourniquet Time Documented: Thigh (Right) - 62 minutes Total: Thigh (Right) - 62 minutes   DICTATION: .Other Dictation: Dictation Number  [id number]  PLAN OF CARE: Admit to inpatient   PATIENT DISPOSITION:  PACU - hemodynamically stable.   Delay start of Pharmacological VTE agent (>24hrs) due to surgical blood loss or risk of bleeding: no

## 2017-07-06 NOTE — Anesthesia Postprocedure Evaluation (Signed)
Anesthesia Post Note  Patient: Nathan ParkerConrad Larson  Procedure(s) Performed: RIGHT TOTAL KNEE ARTHROPLASTY (Right Knee)     Patient location during evaluation: PACU Anesthesia Type: General Level of consciousness: sedated Pain management: pain level controlled Vital Signs Assessment: post-procedure vital signs reviewed and stable Respiratory status: spontaneous breathing and respiratory function stable Cardiovascular status: stable Postop Assessment: no apparent nausea or vomiting Anesthetic complications: no    Last Vitals:  Vitals:   07/06/17 1015 07/06/17 1030  BP: 125/76   Pulse: 80   Resp: 18   Temp:  37 C  SpO2: 96%     Last Pain:  Vitals:   07/06/17 1030  TempSrc:   PainSc: 0-No pain    LLE Motor Response: Purposeful movement (07/06/17 1030) LLE Sensation: Full sensation (07/06/17 1030) RLE Motor Response: Purposeful movement (07/06/17 1030) RLE Sensation: Full sensation (07/06/17 1030)      Kaylamarie Swickard DANIEL

## 2017-07-06 NOTE — Evaluation (Signed)
Physical Therapy Evaluation Patient Details Name: Nathan ParkerConrad Larson MRN: [id number] DOB: 1956-03-02 Today's Date: 07/06/2017   History of Present Illness  Pt s/p R TKR and with recent L TKR  Clinical Impression  Pt s/p R TKR and presents with decreased R LE strength/ROM and post op pain limiting functional mobility.  Pt should progress to dc home with family assist.    Follow Up Recommendations DC plan and follow up therapy as arranged by surgeon    Equipment Recommendations  None recommended by PT    Recommendations for Other Services       Precautions / Restrictions Precautions Precautions: Knee;Fall Required Braces or Orthoses: Knee Immobilizer - Right Knee Immobilizer - Right: Discontinue once straight leg raise with < 10 degree lag Restrictions Weight Bearing Restrictions: No Other Position/Activity Restrictions: WBAT      Mobility  Bed Mobility Overal bed mobility: Needs Assistance Bed Mobility: Supine to Sit     Supine to sit: Min assist     General bed mobility comments: cues for sequence and use of L LE to self assist  Transfers Overall transfer level: Needs assistance Equipment used: Rolling walker (2 wheeled) Transfers: Sit to/from Stand Sit to Stand: Min assist         General transfer comment: cues for LE management and use of UEs to self assist  Ambulation/Gait Ambulation/Gait assistance: Min assist Ambulation Distance (Feet): 60 Feet Assistive device: Rolling walker (2 wheeled) Gait Pattern/deviations: Step-to pattern;Decreased step length - right;Decreased step length - left;Shuffle;Trunk flexed Gait velocity: decr Gait velocity interpretation: Below normal speed for age/gender General Gait Details: cues for sequence, posture and position from [employer]  Stairs            Wheelchair Mobility    Modified Rankin (Stroke Patients Only)       Balance Overall balance assessment: No apparent balance deficits (not formally assessed)                                            Pertinent Vitals/Pain Pain Assessment: 0-10 Pain Score: 2  Pain Location: R knee Pain Descriptors / Indicators: Aching;Sore Pain Intervention(s): Limited activity within patient's tolerance;Monitored during session;Premedicated before session;Ice applied    Home Living Family/patient expects to be discharged to:: Private residence Living Arrangements: Spouse/significant other Available Help at Discharge: Family Type of Home: House Home Access: Stairs to enter   Secretary/administratorntrance Stairs-Number of Steps: 1 Home Layout: One level Home Equipment: Environmental consultantWalker - 2 wheels;Shower seat      Prior Function Level of Independence: Independent               Hand Dominance        Extremity/Trunk Assessment   Upper Extremity Assessment Upper Extremity Assessment: Overall WFL for tasks assessed    Lower Extremity Assessment Lower Extremity Assessment: RLE deficits/detail    Cervical / Trunk Assessment Cervical / Trunk Assessment: Normal  Communication   Communication: No difficulties  Cognition Arousal/Alertness: Awake/alert Behavior During Therapy: WFL for tasks assessed/performed Overall Cognitive Status: Within Functional Limits for tasks assessed                                        General Comments      Exercises Total Joint Exercises Ankle Circles/Pumps: AROM;Both;15 reps;Supine   Assessment/Plan  PT Assessment Patient needs continued PT services  PT Problem List Decreased strength;Decreased range of motion;Decreased activity tolerance;Decreased mobility;Decreased knowledge of use of DME;Pain       PT Treatment Interventions DME instruction;Stair training;Gait training;Functional mobility training;Therapeutic activities;Therapeutic exercise;Patient/family education    PT Goals (Current goals can be found in the Care Plan section)  Acute Rehab PT Goals Patient Stated Goal: Regain IND and walk with  decreased pain PT Goal Formulation: With patient Time For Goal Achievement: 07/10/17 Potential to Achieve Goals: Good    Frequency 7X/week   Barriers to discharge        Co-evaluation               AM-PAC PT "6 Clicks" Daily Activity  Outcome Measure Difficulty turning over in bed (including adjusting bedclothes, sheets and blankets)?: Unable Difficulty moving from lying on back to sitting on the side of the bed? : Unable Difficulty sitting down on and standing up from a chair with arms (e.g., wheelchair, bedside commode, etc,.)?: Unable Help needed moving to and from a bed to chair (including a wheelchair)?: A Little Help needed walking in hospital room?: A Little Help needed climbing 3-5 steps with a railing? : A Little 6 Click Score: 12    End of Session Equipment Utilized During Treatment: Gait belt;Right knee immobilizer Activity Tolerance: Patient tolerated treatment well Patient left: in chair;with call bell/phone within reach;with chair alarm set Nurse Communication: Mobility status PT Visit Diagnosis: Difficulty in walking, not elsewhere classified (R26.2)    Time: [id number] PT Time Calculation (min) (ACUTE ONLY): 23 min   Charges:   PT Evaluation $PT Eval Low Complexity: 1 Low PT Treatments $Gait Training: 8-22 mins   PT G Codes:        Pg [phone]   [name] 07/06/2017, 5:39 PM

## 2017-07-06 NOTE — Anesthesia Procedure Notes (Signed)
Anesthesia Regional Block: Adductor canal block   Pre-Anesthetic Checklist: ,, timeout performed, Correct Patient, Correct Site, Correct Laterality, Correct Procedure, Correct Position, site marked, Risks and benefits discussed,  Surgical consent,  Pre-op evaluation,  At surgeon's request and post-op pain management  Laterality: Right  Prep: chloraprep       Needles:  Injection technique: Single-shot  Needle Type: Stimulator Needle - 80     Needle Length: 10cm  Needle Gauge: 21     Additional Needles:   Narrative:  Start time: 07/06/2017 6:56 AM End time: 07/06/2017 7:06 AM Injection made incrementally with aspirations every 5 mL.  Performed by: Personally

## 2017-07-07 LAB — CBC
HCT: 39 % (ref 39.0–52.0)
Hemoglobin: 12.5 g/dL — ABNORMAL LOW (ref 13.0–17.0)
MCH: 28.7 pg (ref 26.0–34.0)
MCHC: 32.1 g/dL (ref 30.0–36.0)
MCV: 89.4 fL (ref 78.0–100.0)
PLATELETS: 277 10*3/uL (ref 150–400)
RBC: 4.36 MIL/uL (ref 4.22–5.81)
RDW: 13.2 % (ref 11.5–15.5)
WBC: 12.6 10*3/uL — AB (ref 4.0–10.5)

## 2017-07-07 LAB — BASIC METABOLIC PANEL
Anion gap: 7 (ref 5–15)
BUN: 11 mg/dL (ref 6–20)
CALCIUM: 9.4 mg/dL (ref 8.9–10.3)
CO2: 28 mmol/L (ref 22–32)
Chloride: 108 mmol/L (ref 101–111)
Creatinine, Ser: 0.89 mg/dL (ref 0.61–1.24)
GFR calc Af Amer: >60 mL/min (ref >60)
GLUCOSE: 113 mg/dL — AB (ref 65–99)
Potassium: 4.4 mmol/L (ref 3.5–5.1)
SODIUM: 143 mmol/L (ref 135–145)

## 2017-07-07 MED ORDER — SODIUM CHLORIDE 0.9 % IV BOLUS (SEPSIS)
500.0000 mL | Freq: Once | INTRAVENOUS | Status: AC
  Administered 2017-07-07: 500 mL via INTRAVENOUS

## 2017-07-07 MED ORDER — POLYETHYLENE GLYCOL 3350 17 G PO PACK: 17.0000 g | PACK | Freq: Every day | ORAL | 0 refills | Status: AC

## 2017-07-07 MED ORDER — DOCUSATE SODIUM 100 MG PO CAPS: 100.0000 mg | ORAL_CAPSULE | Freq: Two times a day (BID) | ORAL | 1 refills | Status: AC | PRN

## 2017-07-07 MED ORDER — OXYCODONE HCL 5 MG PO TABS: 5.0000 mg | ORAL_TABLET | ORAL | 0 refills | Status: AC | PRN

## 2017-07-07 MED ORDER — METHOCARBAMOL 500 MG PO TABS: 500.0000 mg | ORAL_TABLET | Freq: Four times a day (QID) | ORAL | 1 refills | Status: AC | PRN

## 2017-07-07 MED ORDER — ASPIRIN EC 325 MG PO TBEC: 325.0000 mg | DELAYED_RELEASE_TABLET | Freq: Two times a day (BID) | ORAL | 1 refills | Status: AC

## 2017-07-07 NOTE — Progress Notes (Signed)
Subjective: 1 Day Post-Op Procedure(s) (LRB): RIGHT TOTAL KNEE ARTHROPLASTY (Right) Patient reports pain as 3 on 0-10 scale.    Objective: Vital signs in last 24 hours: Temp:  [97.6 F (36.4 C)-98.4 F (36.9 C)] 98 F (36.7 C) (12/07 0905) Pulse Rate:  [65-92] 81 (12/07 0905) Resp:  [16-17] 16 (12/07 0905) BP: (102-123)/(53-72) 112/72 (12/07 0905) SpO2:  [98 %-100 %] 100 % (12/07 0905)  Intake/Output from previous day: 12/06 0701 - 12/07 0700 In: 4825 [P.O.:2220; I.V.:2550; IV Piggyback:55] Out: 4166 [[id number] [Urine:9475; Blood:50] Intake/Output this shift: Total I/O In: 300 [P.O.:300] Out: -   Recent Labs    07/06/17 1018 07/07/17 0532  HGB 12.4* 12.5*   Recent Labs    07/06/17 1018 07/07/17 0532  WBC 11.6* 12.6*  RBC 4.26 4.36  HCT 37.8* 39.0  PLT 252 277   Recent Labs    07/07/17 0532  NA 143  K 4.4  CL 108  CO2 28  BUN 11  CREATININE 0.89  GLUCOSE 113*  CALCIUM 9.4   Recent Labs    07/04/17 1400  INR 1.07    Neurologically intact Neurovascular intact Intact pulses distally Dorsiflexion/Plantar flexion intact Incision: dressing C/D/I  Assessment/Plan: 1 Day Post-Op Procedure(s) (LRB): RIGHT TOTAL KNEE ARTHROPLASTY (Right) Advance diet Up with therapy D/C IV fluids Discharge home with home health  Leoncio Hansen C 07/07/2017, 12:57 PM

## 2017-07-07 NOTE — Progress Notes (Signed)
Patient c/o dizziness after walking to bathroom. Patient sitting in chair and responsive. Orders for bolus given.

## 2017-07-07 NOTE — Progress Notes (Signed)
Physical Therapy Treatment Patient Details Name: Nathan ParkerConrad Gladue MRN: [id number] DOB: 1956-05-01 Today's Date: 07/07/2017    History of Present Illness Pt s/p R TKR and with recent L TKR    PT Comments    Pt progressing well with mobility and hopeful for dc home tomorrow am.   Follow Up Recommendations  DC plan and follow up therapy as arranged by surgeon     Equipment Recommendations  None recommended by PT    Recommendations for Other Services       Precautions / Restrictions Precautions Precautions: Knee;Fall Required Braces or Orthoses: Knee Immobilizer - Right Knee Immobilizer - Right: Discontinue once straight leg raise with < 10 degree lag Restrictions Weight Bearing Restrictions: No Other Position/Activity Restrictions: WBAT    Mobility  Bed Mobility Overal bed mobility: Needs Assistance Bed Mobility: Sit to Supine       Sit to supine: Min assist   General bed mobility comments: cues for sequence and use of L LE to self assist  Transfers Overall transfer level: Needs assistance Equipment used: Rolling walker (2 wheeled) Transfers: Sit to/from Stand Sit to Stand: Min guard         General transfer comment: min cues for LE management and use of UEs to self assist  Ambulation/Gait Ambulation/Gait assistance: Min assist;Min guard Ambulation Distance (Feet): 140 Feet Assistive device: Rolling walker (2 wheeled) Gait Pattern/deviations: Step-to pattern;Decreased step length - right;Decreased step length - left;Shuffle;Trunk flexed Gait velocity: decr Gait velocity interpretation: Below normal speed for age/gender General Gait Details: cues for sequence, posture and position from [employer]   Stairs            Wheelchair Mobility    Modified Rankin (Stroke Patients Only)       Balance Overall balance assessment: No apparent balance deficits (not formally assessed)                                          Cognition  Arousal/Alertness: Awake/alert Behavior During Therapy: WFL for tasks assessed/performed Overall Cognitive Status: Within Functional Limits for tasks assessed                                        Exercises Total Joint Exercises Ankle Circles/Pumps: AROM;Both;15 reps;Supine Quad Sets: AROM;Both;10 reps;Supine Heel Slides: AAROM;Right;15 reps;Supine Straight Leg Raises: AAROM;AROM;Right;15 reps;Supine Goniometric ROM: AAROM R knee -10 - 90    General Comments        Pertinent Vitals/Pain Pain Assessment: 0-10 Pain Score: 3  Pain Location: R knee Pain Descriptors / Indicators: Aching;Sore Pain Intervention(s): Limited activity within patient's tolerance;Monitored during session;Premedicated before session;Ice applied    Home Living                      Prior Function            PT Goals (current goals can now be found in the care plan section) Acute Rehab PT Goals Patient Stated Goal: Regain IND and walk with decreased pain PT Goal Formulation: With patient Time For Goal Achievement: 07/10/17 Potential to Achieve Goals: Good Progress towards PT goals: Progressing toward goals    Frequency    7X/week      PT Plan Current plan remains appropriate    Co-evaluation  AM-PAC PT "6 Clicks" Daily Activity  Outcome Measure  Difficulty turning over in bed (including adjusting bedclothes, sheets and blankets)?: Unable Difficulty moving from lying on back to sitting on the side of the bed? : A Lot Difficulty sitting down on and standing up from a chair with arms (e.g., wheelchair, bedside commode, etc,.)?: A Lot Help needed moving to and from a bed to chair (including a wheelchair)?: A Little Help needed walking in hospital room?: A Little Help needed climbing 3-5 steps with a railing? : A Little 6 Click Score: 14    End of Session Equipment Utilized During Treatment: Gait belt;Right knee immobilizer Activity Tolerance:  Patient tolerated treatment well Patient left: in bed;with call bell/phone within reach Nurse Communication: Mobility status PT Visit Diagnosis: Difficulty in walking, not elsewhere classified (R26.2)     Time: 1610-96041055-1122 PT Time Calculation (min) (ACUTE ONLY): 27 min  Charges:  $Gait Training: 8-22 mins $Therapeutic Exercise: 8-22 mins                    G Codes:       Pg [phone]    Lovelyn Sheeran 07/07/2017, 11:52 AM

## 2017-07-07 NOTE — Progress Notes (Signed)
Discharge planning, spoke with patient and spouse at bedside. Have chosen [facility] for HH PT. Contacted [facility] for referral. Has RW and 3n1. [phone] 

## 2017-07-07 NOTE — Progress Notes (Signed)
Physical Therapy Treatment Patient Details Name: Nathan ParkerConrad Schwinn MRN: [id number] DOB: [birth year] Today's Date: 07/07/2017    History of Present Illness Pt s/p R TKR and with recent L TKR    PT Comments    Pt motivated, progressing well with mobility and hopeful for dc home tomorrow am.   Follow Up Recommendations  DC plan and follow up therapy as arranged by surgeon     Equipment Recommendations  None recommended by PT    Recommendations for Other Services       Precautions / Restrictions Precautions Precautions: Knee;Fall Required Braces or Orthoses: Knee Immobilizer - Right Knee Immobilizer - Right: Discontinue once straight leg raise with < 10 degree lag Restrictions Weight Bearing Restrictions: No Other Position/Activity Restrictions: WBAT    Mobility  Bed Mobility Overal bed mobility: Needs Assistance Bed Mobility: Sit to Supine       Sit to supine: Min assist   General bed mobility comments: cues for sequence and use of L LE to self assist  Transfers Overall transfer level: Needs assistance Equipment used: Rolling walker (2 wheeled) Transfers: Sit to/from Stand Sit to Stand: Min guard         General transfer comment: min cues for LE management and use of UEs to self assist  Ambulation/Gait Ambulation/Gait assistance: Min assist;Min guard Ambulation Distance (Feet): 150 Feet Assistive device: Rolling walker (2 wheeled) Gait Pattern/deviations: Step-to pattern;Decreased step length - right;Decreased step length - left;Shuffle;Trunk flexed Gait velocity: decr Gait velocity interpretation: Below normal speed for age/gender General Gait Details: cues for sequence, posture and position from [employer]   Stairs            Wheelchair Mobility    Modified Rankin (Stroke Patients Only)       Balance Overall balance assessment: No apparent balance deficits (not formally assessed)                                          Cognition  Arousal/Alertness: Awake/alert Behavior During Therapy: WFL for tasks assessed/performed Overall Cognitive Status: Within Functional Limits for tasks assessed                                        Exercises Total Joint Exercises Ankle Circles/Pumps: AROM;Both;15 reps;Supine    General Comments        Pertinent Vitals/Pain Pain Assessment: 0-10 Pain Score: 4  Pain Location: R knee Pain Descriptors / Indicators: Aching;Sore Pain Intervention(s): Limited activity within patient's tolerance;Monitored during session;Premedicated before session;Ice applied    Home Living                      Prior Function            PT Goals (current goals can now be found in the care plan section) Acute Rehab PT Goals Patient Stated Goal: Regain IND and walk with decreased pain PT Goal Formulation: With patient Time For Goal Achievement: 07/10/17 Potential to Achieve Goals: Good Progress towards PT goals: Progressing toward goals    Frequency    7X/week      PT Plan Current plan remains appropriate    Co-evaluation              AM-PAC PT "6 Clicks" Daily Activity  Outcome Measure  Difficulty turning over  in bed (including adjusting bedclothes, sheets and blankets)?: Unable Difficulty moving from lying on back to sitting on the side of the bed? : A Lot Difficulty sitting down on and standing up from a chair with arms (e.g., wheelchair, bedside commode, etc,.)?: A Lot Help needed moving to and from a bed to chair (including a wheelchair)?: A Little Help needed walking in hospital room?: A Little Help needed climbing 3-5 steps with a railing? : A Little 6 Click Score: 14    End of Session Equipment Utilized During Treatment: Gait belt;Right knee immobilizer Activity Tolerance: Patient tolerated treatment well Patient left: in bed;with call bell/phone within reach Nurse Communication: Mobility status PT Visit Diagnosis: Difficulty in walking, not  elsewhere classified (R26.2)     Time: 1410-1435 PT Time Calculation (min) (ACUTE ONLY): 25 min  Charges:  $Gait Training: 8-22 mins                    G Codes:       Pg [phone]    Toriano Aikey 07/07/2017, 3:10 PM

## 2017-07-07 NOTE — Progress Notes (Signed)
OT Cancellation Note  Patient Details Name: Nathan ParkerConrad Larson MRN: [id number] DOB: March 31, 1956   Cancelled Treatment:    Reason Eval/Treat Not Completed: OT screened, no needs identified, will sign off. Patient had left [facility] in October 2018 and had OT education at that time. Spoke to patient. He remembers all ADL techniques and has all needed DME. Denies need for OT at this time. Will sign off.  Faustino Luecke A Najma Bozarth 07/07/2017, 9:42 AM

## 2017-07-07 NOTE — Op Note (Signed)
NAME:  Nathan Larson, Nathan Larson                   ACCOUNT NO.:  MEDICAL RECORD NO.:  [id number]  LOCATION:                                 FACILITY:  PHYSICIAN:  Jene EveryJeffrey Jackie Larson, M.D.         DATE OF BIRTH:  DATE OF PROCEDURE:  07/06/2017 DATE OF DISCHARGE:                              OPERATIVE REPORT   PREOPERATIVE DIAGNOSIS:  End-stage osteoarthrosis, right knee with varus deformity.  POSTOPERATIVE DIAGNOSIS:  End-stage osteoarthrosis, right knee with varus deformity.  PROCEDURE PERFORMED:  Right total knee arthroplasty utilizing DePuy Attune rotating platform, 8 femur, 8 tibia, 7-mm insert, 41 patella.  ANESTHESIA:  General.  ASSISTANT:  Nathan GrimeJaclyn Bissell, PA.  HISTORY:  A 61 year old male, bone-on-bone arthrosis, medial compartment and varus deformity, indicated for replacement of the degenerated joint, failing conservative treatment.  Risks and benefits were discussed including bleeding, infection, damage to the neurovascular structures, suboptimal range of motion, DVT, PE, anesthetic complications, etc.  TECHNIQUE:  With the patient in supine position after induction of adequate general anesthesia, 2 g of Kefzol, the right lower extremity was prepped, draped, and exsanguinated in usual sterile fashion.  Thigh tourniquet inflated to 250 mmHg.  Midline incision was made over the patella, full-thickness flaps developed.  Median parapatellar arthrotomy performed.  Soft tissue elevated medially preserving the MCL.  Knee was flexed.  Patella everted, tricompartmental osteoarthrosis was noted. Leksell was utilized to remove osteophytes.  Remnants of the medial and lateral menisci were removed as was the ACL.  Cauterized the geniculates.  Step drill utilized to enter the femoral canal, was irrigated, 5-degree right was utilized.  Then, distal femoral cut was performed, he had fairly hard bone.  We sized off the anterior cortex, was an 8; 3 degrees of external rotation, this was pinned and  then we performed our anterior, posterior and chamfer cuts without notching the femur.  Subluxed the tibia defect posteromedially.  External alignment guide, bisecting the tibiotalar joint parallel to the shaft, 3-degree slope, 2 off the medial side, was proximal and 10 off the lateral side. This was then pinned.  Then, we performed our proximal tibial cut.  We then checked our extension gap, it was satisfactory with a 7-mm insert. Knee was then flexed, tibia subluxed.  We sized it to an 8 just the medial aspect of tibial tubercle.  This was pinned, harvested bone graft from the proximal tibia to plug the femoral canal.  We then drilled centrally, performed our punch guide and then turned our attention back towards the femur.  Notch cut was performed after measuring and bisecting the femur.  It was an 8.  We then performed our box cut. Following this, we placed a trial femur 8, drilled our lug holes. Placed a 7 insert, reduced it and had full extension, full flexion, good stability, varus and valgus stressing at 0-30 degrees, negative anterior drawer.  Attention was turned towards the patella, it was measured to a 23, planed to a 15, utilizing an external patellar jig.  It sized to a 41, utilized our sizing paddle, reduced it, it was parallel to the joint.  We drilled our peg holes medializing them.  Placed a trial patella, reduced it and had excellent patellofemoral tracking.  Next, all trials were removed.  We checked posteriorly, had two additional osteophytes that were removed.  Cauterized the geniculates.  The popliteus was intact as was the posterior capsule.  After pulsatile lavage and irrigation, knee was flexed, all surfaces thoroughly dried. The cement was mixed in the back table in appropriate fashion, injected it into the proximal tibia, digitally pressurizing that.  We cemented and impacted the tibia, redundant cement removed.  We cemented and impacted the femur, redundant  cement removed.  Next, a 7 insert was then reduced.  Trial was reduced.  Axial load was placed throughout the curing of the cement.  We cemented and clamped the patella.  Periosteum was injected with 0.25% Marcaine with epinephrine, that was then deposited into the joint as well.  Covered with antibiotic irrigation. After curing of the cement, tourniquet was deflated.  Minimal bleeding and cauterized any bleeders.  I then meticulously removed all redundant cement.  Copiously irrigated the wound.  Flexed the knee, placed a 7 insert, reduced it and had excellent patellofemoral tracking, excellent full extension and full flexion.  Good stability, varus-valgus stressing at 0-30 degrees.  I then reapproximated the patellar arthrotomy with #1 Vicryl interrupted figure-of-eight sutures.  Again, this was a permanent 7 insert that was placed, and then a running Stratafix over top of that. Flexion to gravity at 90 degrees.  Negative anterior drawer.  Excellent patellofemoral tracking.  Copious irrigation with subcutaneous tissue, subcu 2-0 and skin with Prolene.  Sterile dressing applied.  Placed in knee immobilizer.  Extubated without difficulty and transported to the recovery room in satisfactory condition.  The patient tolerated the procedure well.  No complications.  Assistant, Nathan Larson, [place].  Minimal blood loss.  Tourniquet time was 60 minutes.     Jene EveryJeffrey Lacreshia Larson, M.D.     Nathan PenJB/MEDQ  D:  07/06/2017  T:  07/06/2017  Job:  [id number]

## 2017-07-07 NOTE — Progress Notes (Signed)
RN Case Manager following to assist with discharge needs.   Vivi BarrackNicole Jasma Seevers, Theresia MajorsLCSWA, MSW Clinical Social Worker  [phone] 07/07/2017  11:39 AM

## 2017-07-08 LAB — CBC
HEMATOCRIT: 37.6 % — AB (ref 39.0–52.0)
HEMOGLOBIN: 12.1 g/dL — AB (ref 13.0–17.0)
MCH: 28.7 pg (ref 26.0–34.0)
MCHC: 32.2 g/dL (ref 30.0–36.0)
MCV: 89.3 fL (ref 78.0–100.0)
Platelets: 249 10*3/uL (ref 150–400)
RBC: 4.21 MIL/uL — ABNORMAL LOW (ref 4.22–5.81)
RDW: 13.5 % (ref 11.5–15.5)
WBC: 10 10*3/uL (ref 4.0–10.5)

## 2017-07-08 NOTE — Progress Notes (Signed)
Subjective: 2 Days Post-Op Procedure(s) (LRB): RIGHT TOTAL KNEE ARTHROPLASTY (Right)  Patient reports pain as mild to moderate.  Tolerating POs well.  Admits to flatus.  Denies fever, chills, N/V, CP, SOB.  Reports that he worked well with therapy and is ready to go home.  Objective:   VITALS:  Temp:  [98.2 F (36.8 C)-98.5 F (36.9 C)] 98.4 F (36.9 C) (12/08 0643) Pulse Rate:  [65-92] 68 (12/08 0643) Resp:  [15-20] 15 (12/08 0643) BP: (122-130)/(54-70) 122/70 (12/08 0643) SpO2:  [97 %-100 %] 100 % (12/08 [id number])  General: WDWN patient in NAD. Psych:  Appropriate mood and affect. Neuro:  A&O x 3, Moving all extremities, sensation intact to light touch HEENT:  EOMs intact Chest:  Even non-labored respirations Skin:  Dressing/aquacel C/D/I, no rashes or lesions Extremities: warm/dry, mild edema, no erythmea or echymosis.  No lymphadenopathy. Pulses: Popliteus 2+ MSK:  ROM: lacks 5 degrees of TKE, MMT: able to perform quad set, (-) Homan's    LABS Recent Labs    07/06/17 1018 07/07/17 0532 07/08/17 0518  HGB 12.4* 12.5* 12.1*  WBC 11.6* 12.6* 10.0  PLT 252 277 249   Recent Labs    07/07/17 0532  NA 143  K 4.4  CL 108  CO2 28  BUN 11  CREATININE 0.89  GLUCOSE 113*   No results for input(s): LABPT, INR in the last 72 hours.   Assessment/Plan: 2 Days Post-Op Procedure(s) (LRB): RIGHT TOTAL KNEE ARTHROPLASTY (Right)  Patient seen in rounds for Dr. Harrison MonsBeane WBAT R LE D/C home with HHPT Scripts on chart Plan for 2 week outpatient post-op visit with Dr. Meda CoffeeBeane  Dwon Sky, PA-C [facility] Office:  [phone]

## 2017-07-08 NOTE — Progress Notes (Signed)
Physical Therapy Treatment Patient Details Name: Nathan ParkerConrad Larson MRN: [id number] DOB: Jan 13, 1956 Today's Date: 07/08/2017    History of Present Illness Pt s/p R TKR and with recent L TKR    PT Comments    Pt continues to progress with mobility despite increased pain this am.  Reviewed therex and stairs.  Follow Up Recommendations  DC plan and follow up therapy as arranged by surgeon     Equipment Recommendations  None recommended by PT    Recommendations for Other Services       Precautions / Restrictions Precautions Precautions: Knee;Fall Required Braces or Orthoses: Knee Immobilizer - Right Knee Immobilizer - Right: Discontinue once straight leg raise with < 10 degree lag Restrictions Weight Bearing Restrictions: No Other Position/Activity Restrictions: WBAT    Mobility  Bed Mobility Overal bed mobility: Needs Assistance Bed Mobility: Supine to Sit     Supine to sit: Supervision     General bed mobility comments: cues for sequence and use of L LE to self assist  Transfers Overall transfer level: Needs assistance Equipment used: Rolling walker (2 wheeled) Transfers: Sit to/from Stand Sit to Stand: Supervision         General transfer comment: min cues for LE management and use of UEs to self assist  Ambulation/Gait Ambulation/Gait assistance: Min guard;Supervision Ambulation Distance (Feet): 170 Feet Assistive device: Rolling walker (2 wheeled) Gait Pattern/deviations: Step-to pattern;Decreased step length - right;Decreased step length - left;Shuffle;Trunk flexed Gait velocity: decr Gait velocity interpretation: Below normal speed for age/gender General Gait Details: cues for sequence, posture and position from RW   Stairs Stairs: Yes   Stair Management: No rails;Backwards;With walker;Step to pattern Number of Stairs: 2 General stair comments: single step twice with cues for sequence and foot/RW placement  Wheelchair Mobility    Modified  Rankin (Stroke Patients Only)       Balance Overall balance assessment: No apparent balance deficits (not formally assessed)                                          Cognition Arousal/Alertness: Awake/alert Behavior During Therapy: WFL for tasks assessed/performed Overall Cognitive Status: Within Functional Limits for tasks assessed                                        Exercises Total Joint Exercises Ankle Circles/Pumps: AROM;Both;15 reps;Supine Quad Sets: AROM;Both;Supine;15 reps Heel Slides: AAROM;Right;15 reps;Supine Straight Leg Raises: AAROM;AROM;Right;Supine;20 reps Goniometric ROM: AAROM R knee -10 - 75     General Comments        Pertinent Vitals/Pain Pain Assessment: 0-10 Pain Score: 6  Pain Location: R knee Pain Descriptors / Indicators: Aching;Sore Pain Intervention(s): Limited activity within patient's tolerance;Monitored during session;Premedicated before session;Ice applied    Home Living                      Prior Function            PT Goals (current goals can now be found in the care plan section) Acute Rehab PT Goals Patient Stated Goal: Regain IND and walk with decreased pain PT Goal Formulation: With patient Time For Goal Achievement: 07/10/17 Potential to Achieve Goals: Good Progress towards PT goals: Progressing toward goals    Frequency    7X/week  PT Plan Current plan remains appropriate    Co-evaluation              AM-PAC PT "6 Clicks" Daily Activity  Outcome Measure  Difficulty turning over in bed (including adjusting bedclothes, sheets and blankets)?: A Lot Difficulty moving from lying on back to sitting on the side of the bed? : A Lot Difficulty sitting down on and standing up from a chair with arms (e.g., wheelchair, bedside commode, etc,.)?: A Lot Help needed moving to and from a bed to chair (including a wheelchair)?: A Little Help needed walking in hospital room?:  A Little Help needed climbing 3-5 steps with a railing? : A Little 6 Click Score: 15    End of Session Equipment Utilized During Treatment: Gait belt;Right knee immobilizer Activity Tolerance: Patient tolerated treatment well Patient left: in chair;with call bell/phone within reach;with family/visitor present Nurse Communication: Mobility status PT Visit Diagnosis: Difficulty in walking, not elsewhere classified (R26.2)     Time: [id number] PT Time Calculation (min) (ACUTE ONLY): 35 min  Charges:  $Gait Training: 8-22 mins $Therapeutic Exercise: 8-22 mins                    G Codes:       Pg [phone]    Theophile Harvie 07/08/2017, 9:09 AM

## 2017-07-11 NOTE — Discharge Summary (Signed)
Physician Discharge Summary   Patient ID: Torsten Weniger MRN: [id number] DOB/AGE: 1956-06-28 61 y.o.  Admit date: 07/06/2017 Discharge date: 07/08/2017  Primary Diagnosis: right knee primary osteoarthritis  Admission Diagnoses:  Past Medical History:  Diagnosis Date  . Arthritis   . Heart murmur    As a child; NON SYMPTOMATIC   . Pre-diabetes    DENIES; LAST HGB A1C 5.8 EPIC    Discharge Diagnoses:   Active Problems:   Right knee DJD  Estimated body mass index is 30.24 kg/m as calculated from the following:   Height as of this encounter: 6' (1.829 m).   Weight as of this encounter: 101.2 kg (223 lb).  Procedure:  Procedure(s) (LRB): RIGHT TOTAL KNEE ARTHROPLASTY (Right)   Consults: None  HPI: see H&P Laboratory Data: Admission on 07/06/2017, Discharged on 07/08/2017  Component Date Value Ref Range Status  . WBC 07/06/2017 11.6* 4.0 - 10.5 K/uL Final  . RBC 07/06/2017 4.26  4.22 - 5.81 MIL/uL Final  . Hemoglobin 07/06/2017 12.4* 13.0 - 17.0 g/dL Final  . HCT 07/06/2017 37.8* 39.0 - 52.0 % Final  . MCV 07/06/2017 88.7  78.0 - 100.0 fL Final  . MCH 07/06/2017 29.1  26.0 - 34.0 pg Final  . MCHC 07/06/2017 32.8  30.0 - 36.0 g/dL Final  . RDW 07/06/2017 13.4  11.5 - 15.5 % Final  . Platelets 07/06/2017 252  150 - 400 K/uL Final  . Neutrophils Relative % 07/06/2017 86  % Final  . Neutro Abs 07/06/2017 9.9* 1.7 - 7.7 K/uL Final  . Lymphocytes Relative 07/06/2017 12  % Final  . Lymphs Abs 07/06/2017 1.4  0.7 - 4.0 K/uL Final  . Monocytes Relative 07/06/2017 2  % Final  . Monocytes Absolute 07/06/2017 0.2  0.1 - 1.0 K/uL Final  . Eosinophils Relative 07/06/2017 0  % Final  . Eosinophils Absolute 07/06/2017 0.0  0.0 - 0.7 K/uL Final  . Basophils Relative 07/06/2017 0  % Final  . Basophils Absolute 07/06/2017 0.0  0.0 - 0.1 K/uL Final  . WBC 07/07/2017 12.6* 4.0 - 10.5 K/uL Final  . RBC 07/07/2017 4.36  4.22 - 5.81 MIL/uL Final  . Hemoglobin 07/07/2017 12.5* 13.0 - 17.0  g/dL Final  . HCT 07/07/2017 39.0  39.0 - 52.0 % Final  . MCV 07/07/2017 89.4  78.0 - 100.0 fL Final  . MCH 07/07/2017 28.7  26.0 - 34.0 pg Final  . MCHC 07/07/2017 32.1  30.0 - 36.0 g/dL Final  . RDW 07/07/2017 13.2  11.5 - 15.5 % Final  . Platelets 07/07/2017 277  150 - 400 K/uL Final  . Sodium 07/07/2017 143  135 - 145 mmol/L Final  . Potassium 07/07/2017 4.4  3.5 - 5.1 mmol/L Final  . Chloride 07/07/2017 108  101 - 111 mmol/L Final  . CO2 07/07/2017 28  22 - 32 mmol/L Final  . Glucose, Bld 07/07/2017 113* 65 - 99 mg/dL Final  . BUN 07/07/2017 11  6 - 20 mg/dL Final  . Creatinine, Ser 07/07/2017 0.89  0.61 - 1.24 mg/dL Final  . Calcium 07/07/2017 9.4  8.9 - 10.3 mg/dL Final  . GFR calc non Af Amer 07/07/2017 >60  >60 mL/min Final  . GFR calc Af Amer 07/07/2017 >60  >60 mL/min Final   Comment: (NOTE) The eGFR has been calculated using the CKD EPI equation. This calculation has not been validated in all clinical situations. eGFR's persistently <60 mL/min signify possible Chronic Kidney Disease.   . Anion gap  07/07/2017 7  5 - 15 Final  . WBC 07/08/2017 10.0  4.0 - 10.5 K/uL Final  . RBC 07/08/2017 4.21* 4.22 - 5.81 MIL/uL Final  . Hemoglobin 07/08/2017 12.1* 13.0 - 17.0 g/dL Final  . HCT 07/08/2017 37.6* 39.0 - 52.0 % Final  . MCV 07/08/2017 89.3  78.0 - 100.0 fL Final  . MCH 07/08/2017 28.7  26.0 - 34.0 pg Final  . MCHC 07/08/2017 32.2  30.0 - 36.0 g/dL Final  . RDW 07/08/2017 13.5  11.5 - 15.5 % Final  . Platelets 07/08/2017 249  150 - 400 K/uL Final  Hospital Outpatient Visit on 07/04/2017  Component Date Value Ref Range Status  . MRSA, PCR 07/04/2017 NEGATIVE  NEGATIVE Final  . Staphylococcus aureus 07/04/2017 NEGATIVE  NEGATIVE Final   Comment: (NOTE) The Xpert SA Assay (FDA approved for NASAL specimens in patients 71 years of age and older), is one component of a comprehensive surveillance program. It is not intended to diagnose infection nor to guide or monitor  treatment.   Marland Kitchen aPTT 07/04/2017 29  24 - 36 seconds Final  . Prothrombin Time 07/04/2017 13.9  11.4 - 15.2 seconds Final  . INR 07/04/2017 1.07   Final  . Color, Urine 07/04/2017 YELLOW  YELLOW Final  . APPearance 07/04/2017 CLEAR  CLEAR Final  . Specific Gravity, Urine 07/04/2017 1.015  1.005 - 1.030 Final  . pH 07/04/2017 6.0  5.0 - 8.0 Final  . Glucose, UA 07/04/2017 NEGATIVE  NEGATIVE mg/dL Final  . Hgb urine dipstick 07/04/2017 NEGATIVE  NEGATIVE Final  . Bilirubin Urine 07/04/2017 NEGATIVE  NEGATIVE Final  . Ketones, ur 07/04/2017 NEGATIVE  NEGATIVE mg/dL Final  . Protein, ur 07/04/2017 NEGATIVE  NEGATIVE mg/dL Final  . Nitrite 07/04/2017 NEGATIVE  NEGATIVE Final  . Leukocytes, UA 07/04/2017 NEGATIVE  NEGATIVE Final     X-Rays:Dg Knee Right Port  Result Date: 07/06/2017 CLINICAL DATA:  Total knee replacement. EXAM: PORTABLE RIGHT KNEE - 1-2 VIEW COMPARISON:  No recent. FINDINGS: Total right knee replacement. Hardware intact. No acute bony abnormality identified. Peripheral vascular calcification. IMPRESSION: 1. Total right knee replacement. Hardware intact. Normal alignment. 2. Peripheral vascular disease. Electronically Signed   By: Marcello Moores  Register   On: 07/06/2017 10:23    EKG:No orders found for this or any previous visit.   Hospital Course: Romen Yutzy is a 61 y.o. who was admitted to [facility]. They were brought to the operating room on 07/06/2017 and underwent Procedure(s): RIGHT TOTAL KNEE ARTHROPLASTY.  Patient tolerated the procedure well and was later transferred to the recovery room and then to the orthopaedic floor for postoperative care.  They were given PO and IV analgesics for pain control following their surgery.  They were given 24 hours of postoperative antibiotics of  Anti-infectives (From admission, onward)   Start     Dose/Rate Route Frequency Ordered Stop   07/06/17 1400  ceFAZolin (ANCEF) IVPB 2g/100 mL premix     2 g 200 mL/hr over 30 Minutes  Intravenous Every 6 hours 07/06/17 1101 07/07/17 2119   07/06/17 0818  polymyxin B 500,000 Units, bacitracin 50,000 Units in sodium chloride 0.9 % 500 mL irrigation  Status:  Discontinued       As needed 07/06/17 0818 07/06/17 0955   07/06/17 0629  ceFAZolin (ANCEF) 2-4 GM/100ML-% IVPB    Comments:  Christell Faith   : cabinet override      07/06/17 0629 07/06/17 0750   07/06/17 0615  ceFAZolin (ANCEF) IVPB  2g/100 mL premix     2 g 200 mL/hr over 30 Minutes Intravenous On call to O.R. 07/06/17 0601 07/06/17 0820     and started on DVT prophylaxis in the form of Aspirin, TED hose and SCDs.   PT and OT were ordered for total joint protocol.  Discharge planning consulted to help with postop disposition and equipment needs.  Patient had a good night on the evening of surgery.  They started to get up OOB with therapy on day one. Hemovac drain was pulled without difficulty.  Continued to work with therapy into day two. By day two, the patient had progressed with therapy and meeting their goals.  Incision was healing well.  Patient was seen in rounds and was ready to go home.   Diet: Regular diet Activity:WBAT Follow-up:in 10-14 days Disposition - Home Discharged Condition: good   Discharge Instructions    Call MD / Call 911   Complete by:  As directed    If you experience chest pain or shortness of breath, CALL 911 and be transported to the hospital emergency room.  If you develope a fever above 101 F, pus (white drainage) or increased drainage or redness at the wound, or calf pain, call your surgeon's office.   Constipation Prevention   Complete by:  As directed    Drink plenty of fluids.  Prune juice may be helpful.  You may use a stool softener, such as Colace (over the counter) 100 mg twice a day.  Use MiraLax (over the counter) for constipation as needed.   Diet - low sodium heart healthy   Complete by:  As directed    Increase activity slowly as tolerated   Complete by:  As directed     Weight bearing as tolerated   Complete by:  As directed    Laterality:  right   Extremity:  Lower     Allergies as of 07/08/2017   No Known Allergies     Medication List    STOP taking these medications   oxyCODONE-acetaminophen 5-325 MG tablet Commonly known as:  PERCOCET     TAKE these medications   aspirin EC 325 MG tablet Take 1 tablet (325 mg total) by mouth 2 (two) times daily.   docusate sodium 100 MG capsule Commonly known as:  COLACE Take 1 capsule (100 mg total) by mouth 2 (two) times daily as needed for mild constipation.   Ferrous Sulfate 28 MG Tabs Take 1 tablet by mouth daily.   glucosamine-chondroitin 500-400 MG tablet Take 2 tablets by mouth daily.   methocarbamol 500 MG tablet Commonly known as:  ROBAXIN Take 1 tablet (500 mg total) by mouth every 6 (six) hours as needed for muscle spasms.   multivitamin with minerals Tabs tablet Take 1 tablet by mouth daily.   oxyCODONE 5 MG immediate release tablet Commonly known as:  Oxy IR/ROXICODONE Take 1-2 tablets (5-10 mg total) by mouth every 3 (three) hours as needed for moderate pain ((score 4 to 6)).   polyethylene glycol packet Commonly known as:  MIRALAX / GLYCOLAX Take 17 g by mouth daily.   vitamin C 1000 MG tablet Take 1,000 mg by mouth daily.            Discharge Care Instructions  (From admission, onward)        Start     Ordered   07/08/17 0000  Weight bearing as tolerated    Question Answer Comment  Laterality right   Extremity Lower  07/08/17 8550     Follow-up Information    Susa Day, MD Follow up in 2 week(s).   Specialty:  Orthopedic Surgery Contact information: [address] [place] [address] [phone]        [facility] Follow up.   Specialty:  [place] Why:  phsyical therapy Contact information: [address] [place] [address] [phone]           Signed: Lacie Draft,  PA-C Orthopaedic Surgery 07/11/2017, 2:34 PM

## 2019-11-12 IMAGING — DX DG KNEE 1-2V PORT*R*
2 series · 2 of 2 positions shown · non-contrast
Comparison: No recent.

CLINICAL DATA: Total knee replacement.

EXAM:
PORTABLE RIGHT KNEE - 1-2 VIEW

[knee ap]
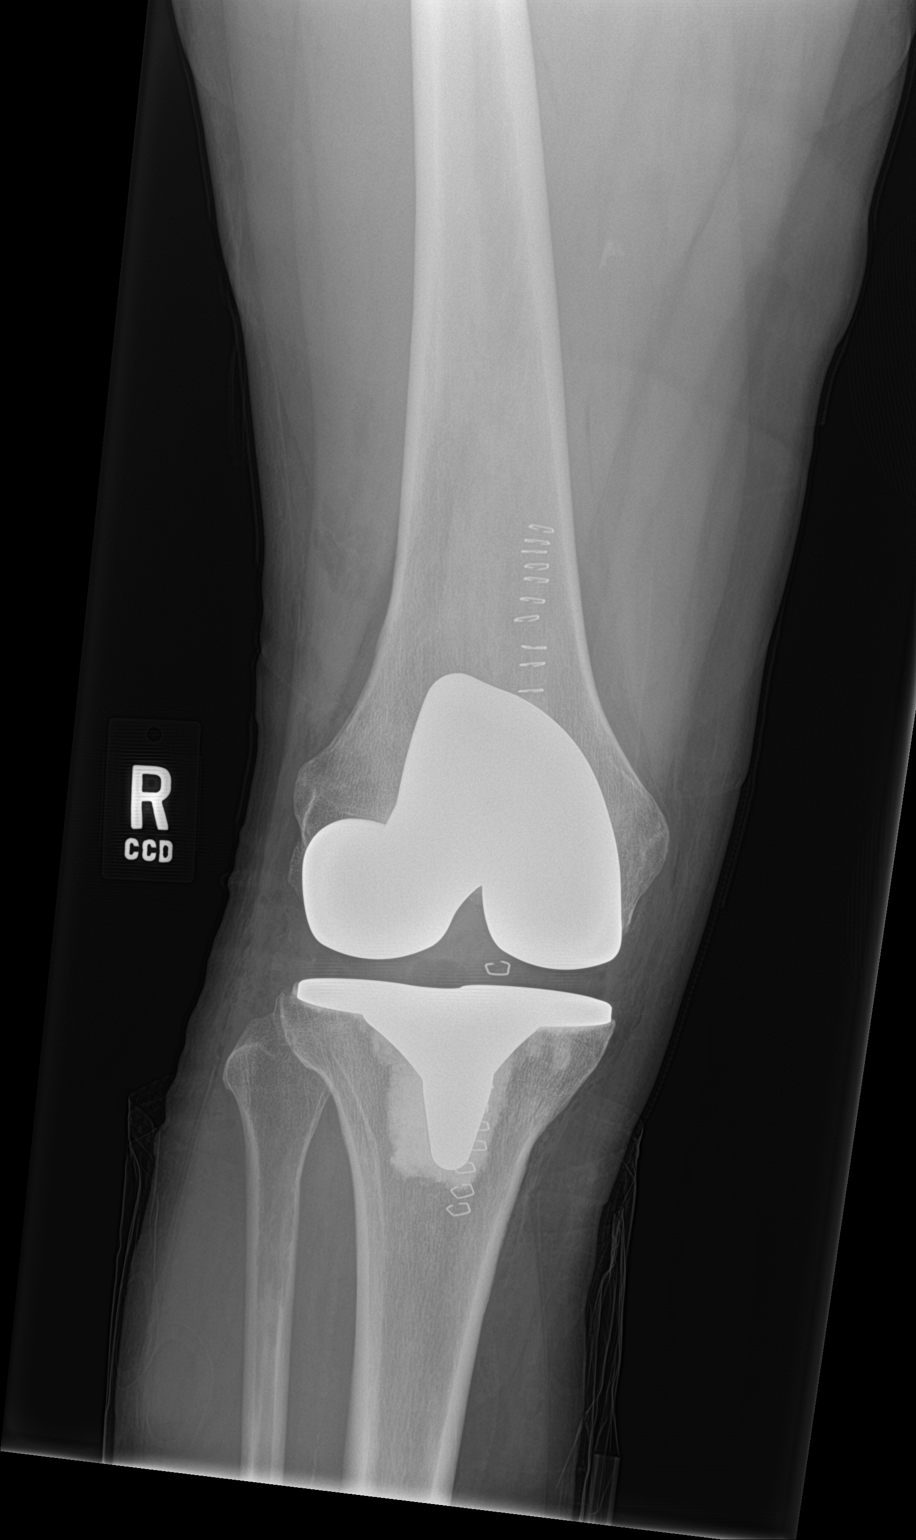

[knee lat]
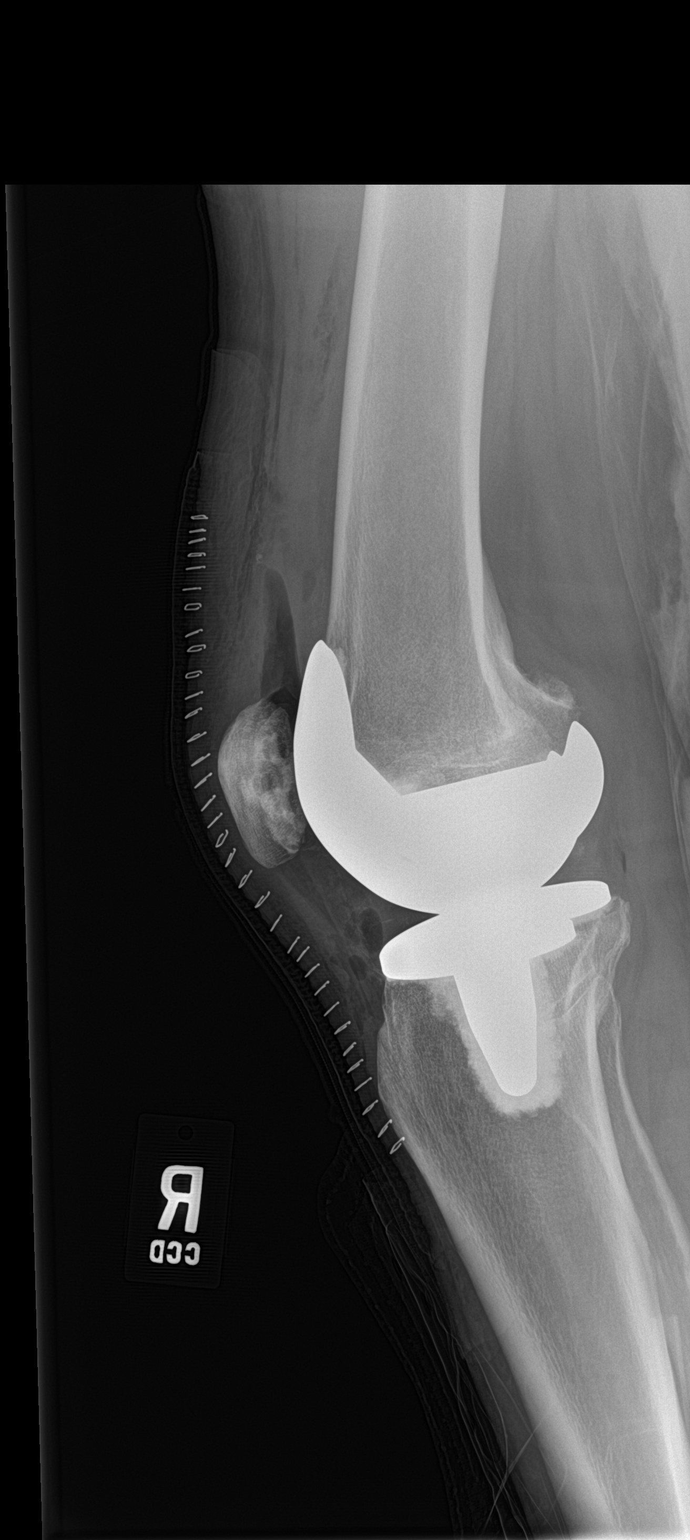

[2 of 2 positions shown; findings below may reference images not displayed]

FINDINGS: Total right knee replacement. Hardware intact. No acute bony
abnormality identified. Peripheral vascular calcification.
IMPRESSION: 1. Total right knee replacement. Hardware intact. Normal alignment.

2. Peripheral vascular disease.
